# Patient Record
Sex: Male | Born: 1950
Health system: Southern US, Community
[De-identification: ages and names within clinical notes are randomized; demographics above are authoritative.]

## PROBLEM LIST (undated history)

## (undated) DIAGNOSIS — E785 Hyperlipidemia, unspecified: Secondary | ICD-10-CM

## (undated) DIAGNOSIS — F909 Attention-deficit hyperactivity disorder, unspecified type: Secondary | ICD-10-CM

## (undated) DIAGNOSIS — K760 Fatty (change of) liver, not elsewhere classified: Secondary | ICD-10-CM

## (undated) DIAGNOSIS — I803 Phlebitis and thrombophlebitis of lower extremities, unspecified: Secondary | ICD-10-CM

## (undated) HISTORY — PX: VASECTOMY: SHX75

## (undated) HISTORY — PX: GANGLION CYST EXCISION: SHX1691

## (undated) HISTORY — PX: HAND SURGERY: SHX662

## (undated) HISTORY — DX: Attention-deficit hyperactivity disorder, unspecified type: F90.9

## (undated) HISTORY — DX: Fatty (change of) liver, not elsewhere classified: K76.0

## (undated) HISTORY — DX: Hyperlipidemia, unspecified: E78.5

## (undated) HISTORY — PX: INFECTED SKIN DEBRIDEMENT: SHX678

---

## 1999-05-14 ENCOUNTER — Encounter: Admission: RE | Admit: 1999-05-14 | Discharge: 1999-05-14 | Payer: Self-pay | Admitting: Emergency Medicine

## 2000-06-11 ENCOUNTER — Encounter: Admission: RE | Admit: 2000-06-11 | Discharge: 2000-06-11 | Payer: Self-pay | Admitting: Oncology

## 2002-07-01 ENCOUNTER — Encounter: Admission: RE | Admit: 2002-07-01 | Discharge: 2002-07-01 | Payer: Self-pay | Admitting: Oncology

## 2003-06-17 ENCOUNTER — Ambulatory Visit (HOSPITAL_COMMUNITY): Admission: RE | Admit: 2003-06-17 | Discharge: 2003-06-17 | Payer: Self-pay | Admitting: Obstetrics & Gynecology

## 2003-07-01 ENCOUNTER — Encounter: Admission: RE | Admit: 2003-07-01 | Discharge: 2003-07-01 | Payer: Self-pay | Admitting: Oncology

## 2003-08-15 ENCOUNTER — Ambulatory Visit (HOSPITAL_COMMUNITY): Admission: RE | Admit: 2003-08-15 | Discharge: 2003-08-15 | Payer: Self-pay | Admitting: Obstetrics and Gynecology

## 2003-09-03 ENCOUNTER — Ambulatory Visit (HOSPITAL_COMMUNITY): Admission: RE | Admit: 2003-09-03 | Discharge: 2003-09-03 | Payer: Self-pay | Admitting: Internal Medicine

## 2004-02-14 ENCOUNTER — Emergency Department (HOSPITAL_COMMUNITY): Admission: EM | Admit: 2004-02-14 | Discharge: 2004-02-14 | Payer: Self-pay | Admitting: Emergency Medicine

## 2004-02-26 ENCOUNTER — Emergency Department (HOSPITAL_COMMUNITY): Admission: EM | Admit: 2004-02-26 | Discharge: 2004-02-26 | Payer: Self-pay | Admitting: Emergency Medicine

## 2004-02-26 ENCOUNTER — Ambulatory Visit (HOSPITAL_COMMUNITY): Admission: RE | Admit: 2004-02-26 | Discharge: 2004-02-26 | Payer: Self-pay | Admitting: Obstetrics and Gynecology

## 2004-08-09 ENCOUNTER — Encounter: Admission: RE | Admit: 2004-08-09 | Discharge: 2004-08-09 | Payer: Self-pay | Admitting: Oncology

## 2004-08-09 ENCOUNTER — Ambulatory Visit (HOSPITAL_COMMUNITY): Payer: Self-pay | Admitting: Oncology

## 2006-09-29 ENCOUNTER — Emergency Department (HOSPITAL_COMMUNITY): Admission: EM | Admit: 2006-09-29 | Discharge: 2006-09-29 | Payer: Self-pay | Admitting: Emergency Medicine

## 2006-10-01 ENCOUNTER — Ambulatory Visit: Payer: Self-pay | Admitting: Orthopedic Surgery

## 2006-10-01 ENCOUNTER — Ambulatory Visit (HOSPITAL_COMMUNITY): Payer: Self-pay | Admitting: Orthopedic Surgery

## 2006-10-01 ENCOUNTER — Encounter (HOSPITAL_COMMUNITY): Admission: RE | Admit: 2006-10-01 | Discharge: 2006-10-02 | Payer: Self-pay | Admitting: Emergency Medicine

## 2006-10-03 ENCOUNTER — Ambulatory Visit: Payer: Self-pay | Admitting: Orthopedic Surgery

## 2006-10-03 ENCOUNTER — Encounter (HOSPITAL_COMMUNITY): Admission: RE | Admit: 2006-10-03 | Discharge: 2006-11-02 | Payer: Self-pay | Admitting: Orthopedic Surgery

## 2006-10-10 ENCOUNTER — Ambulatory Visit: Payer: Self-pay | Admitting: Orthopedic Surgery

## 2006-10-10 DIAGNOSIS — S61209A Unspecified open wound of unspecified finger without damage to nail, initial encounter: Secondary | ICD-10-CM | POA: Insufficient documentation

## 2006-10-12 ENCOUNTER — Ambulatory Visit (HOSPITAL_COMMUNITY): Payer: Self-pay | Admitting: Obstetrics and Gynecology

## 2006-10-18 ENCOUNTER — Ambulatory Visit: Payer: Self-pay | Admitting: Orthopedic Surgery

## 2006-10-18 DIAGNOSIS — L03019 Cellulitis of unspecified finger: Secondary | ICD-10-CM

## 2006-10-18 DIAGNOSIS — L02519 Cutaneous abscess of unspecified hand: Secondary | ICD-10-CM | POA: Insufficient documentation

## 2006-10-22 ENCOUNTER — Ambulatory Visit: Payer: Self-pay | Admitting: Orthopedic Surgery

## 2006-10-22 DIAGNOSIS — M009 Pyogenic arthritis, unspecified: Secondary | ICD-10-CM | POA: Insufficient documentation

## 2006-10-23 ENCOUNTER — Ambulatory Visit (HOSPITAL_COMMUNITY): Admission: RE | Admit: 2006-10-23 | Discharge: 2006-10-23 | Payer: Self-pay | Admitting: Orthopedic Surgery

## 2006-10-23 ENCOUNTER — Ambulatory Visit: Payer: Self-pay | Admitting: Orthopedic Surgery

## 2006-10-29 ENCOUNTER — Ambulatory Visit: Payer: Self-pay | Admitting: Infectious Disease

## 2006-10-29 DIAGNOSIS — I808 Phlebitis and thrombophlebitis of other sites: Secondary | ICD-10-CM | POA: Insufficient documentation

## 2006-10-29 DIAGNOSIS — M86649 Other chronic osteomyelitis, unspecified hand: Secondary | ICD-10-CM | POA: Insufficient documentation

## 2006-10-29 DIAGNOSIS — D682 Hereditary deficiency of other clotting factors: Secondary | ICD-10-CM | POA: Insufficient documentation

## 2006-10-29 LAB — CONVERTED CEMR LAB
AST: 90 units/L — ABNORMAL HIGH (ref 0–37)
BUN: 21 mg/dL (ref 6–23)
Basophils Absolute: 0 10*3/uL (ref 0.0–0.1)
Basophils Relative: 0 % (ref 0–1)
Calcium: 9.1 mg/dL (ref 8.4–10.5)
Chloride: 103 meq/L (ref 96–112)
Creatinine, Ser: 1.26 mg/dL (ref 0.40–1.50)
Eosinophils Absolute: 0.1 10*3/uL (ref 0.0–0.7)
Eosinophils Relative: 1 % (ref 0–5)
HCT: 48.2 % (ref 39.0–52.0)
Hemoglobin: 15.6 g/dL (ref 13.0–17.0)
Lymphocytes Relative: 22 % (ref 12–46)
MCHC: 32.4 g/dL (ref 30.0–36.0)
MCV: 79.9 fL (ref 78.0–100.0)
Monocytes Absolute: 0.6 10*3/uL (ref 0.2–0.7)
Platelets: 220 10*3/uL (ref 150–400)
RDW: 14.4 % — ABNORMAL HIGH (ref 11.5–14.0)
Sed Rate: 3 mm/hr (ref 0–16)
Total Bilirubin: 0.8 mg/dL (ref 0.3–1.2)

## 2006-10-30 ENCOUNTER — Ambulatory Visit (HOSPITAL_COMMUNITY): Admission: RE | Admit: 2006-10-30 | Discharge: 2006-10-30 | Payer: Self-pay | Admitting: Orthopedic Surgery

## 2006-10-31 ENCOUNTER — Encounter: Payer: Self-pay | Admitting: Infectious Disease

## 2006-10-31 ENCOUNTER — Ambulatory Visit (HOSPITAL_COMMUNITY): Admission: RE | Admit: 2006-10-31 | Discharge: 2006-11-01 | Payer: Self-pay | Admitting: Orthopedic Surgery

## 2006-10-31 ENCOUNTER — Encounter (INDEPENDENT_AMBULATORY_CARE_PROVIDER_SITE_OTHER): Payer: Self-pay | Admitting: Orthopedic Surgery

## 2006-11-15 ENCOUNTER — Encounter: Payer: Self-pay | Admitting: Orthopedic Surgery

## 2006-11-20 ENCOUNTER — Encounter: Payer: Self-pay | Admitting: Orthopedic Surgery

## 2006-11-28 ENCOUNTER — Ambulatory Visit (HOSPITAL_COMMUNITY): Admission: RE | Admit: 2006-11-28 | Discharge: 2006-11-28 | Payer: Self-pay | Admitting: Internal Medicine

## 2006-12-14 ENCOUNTER — Encounter: Payer: Self-pay | Admitting: Infectious Disease

## 2006-12-17 ENCOUNTER — Ambulatory Visit: Payer: Self-pay | Admitting: Infectious Disease

## 2006-12-17 LAB — CONVERTED CEMR LAB
Albumin: 4.5 g/dL (ref 3.5–5.2)
BUN: 24 mg/dL — ABNORMAL HIGH (ref 6–23)
CO2: 25 meq/L (ref 19–32)
CRP: 0.1 mg/dL (ref <0.6)
Glucose, Bld: 112 mg/dL — ABNORMAL HIGH (ref 70–99)
HCT: 44.6 % (ref 39.0–52.0)
Hemoglobin: 14.3 g/dL (ref 13.0–17.0)
MCHC: 32.1 g/dL (ref 30.0–36.0)
Potassium: 4.5 meq/L (ref 3.5–5.3)
RBC: 5.55 M/uL (ref 4.22–5.81)
Sodium: 142 meq/L (ref 135–145)
Total Bilirubin: 0.5 mg/dL (ref 0.3–1.2)
Total Protein: 6.9 g/dL (ref 6.0–8.3)

## 2006-12-26 ENCOUNTER — Encounter: Payer: Self-pay | Admitting: Orthopedic Surgery

## 2008-01-21 ENCOUNTER — Ambulatory Visit (HOSPITAL_COMMUNITY): Admission: RE | Admit: 2008-01-21 | Discharge: 2008-01-21 | Payer: Self-pay | Admitting: Family Medicine

## 2008-07-16 ENCOUNTER — Ambulatory Visit (HOSPITAL_COMMUNITY): Admission: RE | Admit: 2008-07-16 | Discharge: 2008-07-16 | Payer: Self-pay | Admitting: Internal Medicine

## 2010-01-23 ENCOUNTER — Encounter: Payer: Self-pay | Admitting: Orthopedic Surgery

## 2010-05-17 NOTE — Op Note (Signed)
NAMEMarland Kitchen  Kenneth Zimmerman, Kenneth Zimmerman NO.:  [id number]   MEDICAL RECORD NO.:  [id number]          PATIENT TYPE:  OIB   LOCATION:  3015                         FACILITY:  [facility]   PHYSICIAN:  Kenneth Ano. Gramig III, Kenneth ZimmermanDATE OF BIRTH:  1950-11-25   DATE OF PROCEDURE:  10/31/2006  DATE OF DISCHARGE:                               OPERATIVE REPORT   PREOPERATIVE DIAGNOSIS:  Right middle finger infection, 30 days out from  initial injury, with continued drainage, chondrolysis about the joint,  and evidence of osteomyelitis per radiographs.  Plan for irrigation and  debridement and bone biopsy.   POSTOPERATIVE DIAGNOSIS:  Right middle finger infection, 30 days out  from initial injury, with continued drainage, chondrolysis about the  joint, and evidence of osteomyelitis per radiographs.  Plan for  irrigation and debridement and bone biopsy.   PROCEDURES:  1. Irrigation and debridement with DIP arthrotomy and debridement of      skin, subcutaneous tissue, tendon, and joint tissue.  2. Bone biopsy, deep in nature, about the middle phalanx and distal      phalanx at the DIP joint.   SURGEON:  Kenneth Zimmerman, M.D.   ASSISTANT:  None.   COMPLICATIONS:  None.   ANESTHESIA:  General.   TOURNIQUET TIME:  Less than an hour.   INDICATIONS FOR THE PROCEDURE:  Kenneth Zimmerman is a 60 year old who  sustained a mowing injury 30 days ago.  He has been treated with an  array of antibiotics, p.o.  Seven days ago, he had I&D by Dr. Romeo Apple  in [place].  Due to slow progress, chondrolysis, and radiographic  changes, I was asked to see him in consult.  Upon seeing the patient, I  felt he had significant chondrolysis, eburnation of the DIP joint, and  felt this represented what was likely bony reaction and probable  osteomyelitis.  I felt that he absolutely needed to be treated for  osteomyelitis and that a presumptive course of treatment for  osteomyelitis be given.  I consulted Dr. Daiva Eves  of infectious disease,  who concurred.  It was recommended a bone biopsy be obtained.  He was  off antibiotics for 48 hours prior to today's I&D.  He was consented.  Arm was marked.  All instructions were given.   In the operative suite, the patient underwent smooth induction of  anesthesia, laid supine, appropriately padded, and prepped and draped in  the usual sterile fashion.  Once this was done, his prior incision S-  shaped in nature was reopened.  This was opened with pressure only, and  following this I took cultures and scraped the peritendinous tissue.  A  collagenous debris was noted, and de-epithelialized tissue was removed.  This was the initial culture.  This was done with Swab, aerobic,  anaerobic, and I sent all cultures for aerobic, anaerobic, AFB,  atypical, mycobacterial cultures, and fungal cultures.  Following this,  I then removed Monocryl sutures in the extensor apparatus.  There were  multiple sutures.  The tendon was significantly attenuated, and it was  debrided.  The central slip had marked  changes and poor continuity.  This appeared to have worsened since his initial I&D, per my  conversation with Dr. Romeo Apple.   Following this, I entered the DIP joint and performed a thorough and  aggressive irrigation and debridement.  There was devitalized tissue in  this region.  Following this, I took bone cultures of the distal phalanx  and middle phalanx.  The patient had a poor-appearing bone, and  vascularity was a bit tenuous at the distal edge of the middle phalanx.  The patient then underwent irrigation with 3 liters of saline.  Following this, a Penrose drain was placed, and two Prolene sutures were  placed. but the medial and lateral margins of the wound were left open.  I dressing him with Adaptic and gauze, followed by sterile wrap.  He  tolerated this well.   I will consult ID in the recovery room for his antibiotic measures.  I  have discussed with the  patient these issues at length, dos and don'ts,  etc.   I sent tissue culture, bone culture, and bone specimen to pathology, and  general swabs of the wound.  The patient tolerated this well.  We are  going to plan for an aggressive course of treatment.  It is my hope that  this joint may autofuse by itself, but I do not feel he will have a good  joint given the extensor attenuation and the eburnated bone and advanced  chondrolysis changes.  I have discussed this with the patient at length  preoperatively and verified this intraoperatively of course.  All  questions have been encouraged and answered.           ______________________________  Kenneth Zimmerman, M.D.     Nash Mantis  D:  10/31/2006  T:  11/01/2006  Job:  [id number]   cc:   Acey Lav, MD  Vickki Hearing, M.D.

## 2010-05-17 NOTE — Op Note (Signed)
NAME:  CAREY, JOHNDROW NO.:  [id number]   MEDICAL RECORD NO.:  [id number]          PATIENT TYPE:  AMB   LOCATION:  DAY                           FACILITY:  [facility]   PHYSICIAN:  Lionel December, M.D.    DATE OF BIRTH:  [birth year]   DATE OF PROCEDURE:  DATE OF DISCHARGE:  07/16/2008                               OPERATIVE REPORT   PROCEDURE:  Colonoscopy.   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  His last exam was 8 years  ago and was normal.  His mother had surgical resection for a large  adenoma.  His maternal uncle had colon carcinoma in his 21s.  He has no  GI symptoms other than occasional fecal seepage.  Procedure and risks  were reviewed with the patient, informed consent was obtained.   MEDICATIONS FOR CONSCIOUS SEDATION:  Demerol 100 mg IV in divided dose,  Versed 8 mg IV.   FINDINGS:  Procedure performed in Endoscopy Suite.  The patient's vital  signs and O2 sat were monitored during the procedure and remained  stable.  The patient was placed in left lateral recumbent position,  rectal examination performed.  No abnormality noted on external or  digital exam.  Sphincter tone appeared to be normal.  Pentax videoscope  was placed in the rectum and advanced under vision into sigmoid colon  beyond.  The tortuous sigmoid colon.  Some difficulty in passing the  scope across the splenic flexure again across the hepatic flexure.  He  had lot of liquid stool, but overall prep was very satisfactory.  The  scope was passed into the cecum, which was identified by ileocecal  valve.  He had to return 3 times in order to see rest of the cecum.  Pictures taken for the record.  As the scope was withdrawn, colonic  mucosa was once again carefully examined and no polyps and no tumor  masses were noted. The rectal mucosa was normal.  The scope was  retroflexed to examine anorectal junction, which was  unremarkable.  Endoscope was straightened and withdrawn.  The patient tolerated the  procedure well.   Withdrawal time was 8 minutes.   FINAL DIAGNOSIS:  Redundant, otherwise normal colonoscopy.   RECOMMENDATIONS:  1. High-fiber diet plus fiber supplement 3 to 4 g daily.  If he still      experiences intermittent seepage, I would like him to take      loperamide OTC 2 mg daily.  If these measures do not help, give me      a call.  2. He should consider next screening in 5 years.      Lionel December, M.D.  Electronically Signed    NR/MEDQ  D:  07/16/2008  T:  07/17/2008  Job:  [id number]

## 2010-05-17 NOTE — Op Note (Signed)
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  The patient injured his hand on  09/28 in a farm[place].  Apparently he scraped as across a bolt and this  opened up his finger near the DIP joint.  He was treated in the  emergency room with closure of the wound after irrigation.  He was also  treated with Rocephin and Levaquin, changed to clindamycin when his  symptoms did not improve.  He reported increased pain on the weekend and  we decided to proceed with surgical incision and drainage.   PREOPERATIVE DIAGNOSIS:  Cellulitis right long finger.   POSTOPERATIVE DIAGNOSIS:  Septic arthritis.   PROCEDURE:  Right long finger distal interphalangeal joint arthrotomy  incision and drainage.   SURGEON:  Vickki Hearing, M.D., no assistants.   ANESTHESIA:  Bier block.   FINDINGS:  Chondrolysis of the joint.  Cultures were obtained.  They  were sent for micro, no blood loss.  The patient to recovery room in  stable condition.   DETAILS OF PROCEDURE:  After proper identification of the patient and  marking of the right long finger as the surgical site, the patient was  taken to surgery where he had a Bier block.  The wound was extended  proximally and distally on either side and the extensor tendon was found  to be 50% still intact with 50% of injury of the tendon.  This area was  taken advantage of and the joint was opened.  There was a chondrolysis  of the joint but no purulence. The tendon was repaired.  After  irrigation and debridement of the wound and the joint,  Penrose drain was  inserted and 3-0 nylon sutures were used to close the wound partially.  The finger was dressed sterilely.  The patient was taken to recovery  room in stable condition.   POSTOPERATIVE PLAN:  For continued antibiotics, follow-up visit for  dressing change.      Vickki Hearing, M.D.  Electronically Signed    SEH/MEDQ  D:  11/14/2006  T:  11/14/2006  Job:  [id number]

## 2010-05-17 NOTE — Procedures (Signed)
NAME:  CAMARI, WISHAM NO.:  [id number]   MEDICAL RECORD NO.:  [id number]          PATIENT TYPE:  OUT   LOCATION:  DFTL                          FACILITY:  [facility]   PHYSICIAN:  Donna Bernard, M.D.DATE OF BIRTH:  1950/12/28   DATE OF PROCEDURE:  01/21/2008  DATE OF DISCHARGE:                                  STRESS TEST   INDICATIONS FOR THE TEST:  This patient is a 60 year old white male with  a history of atypical chest discomfort.  Frankly, the discomfort sounds  more like reflux; however, with a strong family history of coronary  artery disease and personal risk factors, the stress test was performed  for the purposes of risk stratification and also in preparation of  resuming an active exercise program.   Stress test was performed in standard Bruce protocol.  Resting EKG  revealed normal sinus rhythm.  No significant ST-T changes.  The  patient's starting blood pressure was 148/88.  The patient tolerated the  first 3 stages of the test extremely well.  He was initially slow to  increase his heart rate substantially, which is in itself a good sign.  He reached all the way into midway through the fourth stage.  At this  point, his heart rate was 154.  This exceeded and the sub max predicted  heart rate is maximum, predicted heart rate is 160.  At peak exercise,  he reached a systolic of 180, which is considered also within normal  limits.  At the peak heart rate at 0.08 seconds pass the J-point, there  was minimal ST depression in the chest and limb leads.  In addition, the  slope at this point was sharply ascending.   IMPRESSION:  Negative adequate stress test.   PLAN:  The patient given full permission to return to very active  exercise program.      W. Simone Curia, M.D.  Electronically Signed     WSL/MEDQ  D:  01/21/2008  T:  01/21/2008  Job:  [id number]

## 2010-10-12 LAB — DIFFERENTIAL
Basophils Relative: 1
Lymphocytes Relative: 24
Lymphs Abs: 1.5
Monocytes Relative: 8
Neutro Abs: 3.9
Neutrophils Relative %: 64

## 2010-10-12 LAB — HEPATIC FUNCTION PANEL
AST: 54 — ABNORMAL HIGH
Albumin: 3.9
Alkaline Phosphatase: 64
Total Bilirubin: 0.9

## 2010-10-12 LAB — BASIC METABOLIC PANEL
Calcium: 9.1
Creatinine, Ser: 1.15
GFR calc Af Amer: >60
GFR calc non Af Amer: >60

## 2010-10-12 LAB — ANAEROBIC CULTURE

## 2010-10-12 LAB — FUNGUS CULTURE W SMEAR: Fungal Smear: NONE SEEN

## 2010-10-12 LAB — HEMOGLOBIN A1C: Mean Plasma Glucose: 111

## 2010-10-12 LAB — AFB CULTURE WITH SMEAR (NOT AT ARMC): Acid Fast Smear: NONE SEEN

## 2010-10-12 LAB — TISSUE CULTURE
Culture: NO GROWTH
Gram Stain: NONE SEEN

## 2010-10-12 LAB — CBC
RBC: 5.49
WBC: 6

## 2010-10-12 LAB — WOUND CULTURE

## 2010-10-12 LAB — CULTURE, ROUTINE-ABSCESS

## 2011-07-25 ENCOUNTER — Other Ambulatory Visit (HOSPITAL_COMMUNITY)
Admission: RE | Admit: 2011-07-25 | Discharge: 2011-07-25 | Disposition: A | Discharge status: Home / Self Care | Payer: 59 | Source: Ambulatory Visit | Attending: General Surgery | Admitting: General Surgery

## 2011-07-25 ENCOUNTER — Other Ambulatory Visit: Payer: Self-pay | Admitting: General Surgery

## 2011-07-25 DIAGNOSIS — D235 Other benign neoplasm of skin of trunk: Secondary | ICD-10-CM | POA: Insufficient documentation

## 2012-04-21 ENCOUNTER — Encounter: Payer: Self-pay | Admitting: Internal Medicine

## 2012-04-21 DIAGNOSIS — I808 Phlebitis and thrombophlebitis of other sites: Secondary | ICD-10-CM

## 2012-04-23 ENCOUNTER — Encounter: Payer: Self-pay | Admitting: Orthopedic Surgery

## 2012-04-23 ENCOUNTER — Ambulatory Visit (INDEPENDENT_AMBULATORY_CARE_PROVIDER_SITE_OTHER): Payer: 59 | Admitting: Orthopedic Surgery

## 2012-04-23 ENCOUNTER — Ambulatory Visit (INDEPENDENT_AMBULATORY_CARE_PROVIDER_SITE_OTHER): Payer: 59

## 2012-04-23 VITALS — BP 104/64 | Ht 77.0 in | Wt 282.0 lb

## 2012-04-23 DIAGNOSIS — S83282A Other tear of lateral meniscus, current injury, left knee, initial encounter: Secondary | ICD-10-CM

## 2012-04-23 DIAGNOSIS — M25569 Pain in unspecified knee: Secondary | ICD-10-CM

## 2012-04-23 DIAGNOSIS — S83289A Other tear of lateral meniscus, current injury, unspecified knee, initial encounter: Secondary | ICD-10-CM | POA: Insufficient documentation

## 2012-04-23 DIAGNOSIS — M25562 Pain in left knee: Secondary | ICD-10-CM

## 2012-04-23 NOTE — Progress Notes (Signed)
Patient ID: Kenneth Zimmerman, male   DOB: [birth year], 62 y.o.   MRN: [id number] Chief Complaint  Patient presents with  . 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 The symptoms came on suddenly and have been marked by 2 episodes of giving way. Pain is sharp stabbing brief an 8/10. The symptoms seem to come and go and is worse with valgus load on the knee. Review of systems all negative  Allergies negative  Heterozygote factor V Leyden  Obesity  Surgery right long finger x2 for infection  [employer]  Family history heart disease and diabetes  Married  No smoking  Rare social alcohol use and caffeine use noted   Occupation Geologist, engineering  Physical Exam(12)  Vital signs:   GENERAL: normal development   CDV: pulses are normal   Skin: normal  Lymph: nodes were not palpable/normal  Psychiatric: awake, alert and oriented  Neuro: normal sensation  MSK  Gait: Altered gait  Upper extremity exam  The right and left upper extremity:   Inspection and palpation revealed no abnormalities in the upper extremities.   Range of motion is full without contracture.  Motor exam is normal with grade 5 strength.  The joints are fully reduced without subluxation.  There is no atrophy or tremor and muscle tone is normal.  All joints are stable.    1 Inspection tenderness lateral compartment 2 Range of Motion full range of motion 3 Motor grade 5 4 Stability normal Positive McMurray sign laterally  Other side:  5 normal range of motion strength stability alignment 6 bilateral normal cardiovascular pulses  Imaging x-rays show possible loose bodies mild arthritis some patellofemoral disease  Assessment: Lateral meniscal tear loose body    Plan: Arthroscopic surgery left knee  removal of loose bodies and lateral meniscectomy  I discussed our options included preoperative MRI but he agrees with me that we should just proceed based on his symptoms  He will get back to [place] regarding a date

## 2012-04-23 NOTE — Patient Instructions (Signed)
Arthroscopic Procedure, Knee An arthroscopic procedure can find what is wrong with your knee. PROCEDURE Arthroscopy is a surgical technique that allows your orthopedic surgeon to diagnose and treat your knee injury with accuracy. They will look into your knee through a small instrument. This is almost like a small (pencil sized) telescope. Because arthroscopy affects your knee less than open knee surgery, you can anticipate a more rapid recovery. Taking an active role by following your caregiver's instructions will help with rapid and complete recovery. Use crutches, rest, elevation, ice, and knee exercises as instructed. The length of recovery depends on various factors including type of injury, age, physical condition, medical conditions, and your rehabilitation. Your knee is the joint between the large bones (femur and tibia) in your leg. Cartilage covers these bone ends which are smooth and slippery and allow your knee to bend and move smoothly. Two menisci, thick, semi-lunar shaped pads of cartilage which form a rim inside the joint, help absorb shock and stabilize your knee. Ligaments bind the bones together and support your knee joint. Muscles move the joint, help support your knee, and take stress off the joint itself. Because of this all programs and physical therapy to rehabilitate an injured or repaired knee require rebuilding and strengthening your muscles. AFTER THE PROCEDURE  After the procedure, you will be moved to a recovery area until most of the effects of the medication have worn off. Your caregiver will discuss the test results with you.   Only take over-the-counter or prescription medicines for pain, discomfort, or fever as directed by your caregiver.  SEEK MEDICAL CARE IF:    You have increased bleeding from your wounds.   You see redness, swelling, or have increasing pain in your wounds.   You have pus coming from your wound.   You have an oral temperature above 102 F  (38.9 C).   You notice a bad smell coming from the wound or dressing.   You have severe pain with any motion of your knee.  SEEK IMMEDIATE MEDICAL CARE IF:    You develop a rash.   You have difficulty breathing.   You have any allergic problems.  Document Released: 12/17/1999 Document Revised: 03/13/2011 Document Reviewed: 07/10/2007 [employer] Patient Information 2013 [employer].    

## 2012-10-21 ENCOUNTER — Encounter: Payer: Self-pay | Admitting: Family Medicine

## 2012-10-21 ENCOUNTER — Other Ambulatory Visit: Payer: Self-pay

## 2012-10-21 ENCOUNTER — Ambulatory Visit (HOSPITAL_COMMUNITY)
Admission: RE | Admit: 2012-10-21 | Discharge: 2012-10-21 | Disposition: A | Discharge status: Home / Self Care | Payer: 59 | Source: Ambulatory Visit | Attending: Family Medicine | Admitting: Family Medicine

## 2012-10-21 ENCOUNTER — Ambulatory Visit (INDEPENDENT_AMBULATORY_CARE_PROVIDER_SITE_OTHER): Payer: 59 | Admitting: Family Medicine

## 2012-10-21 VITALS — BP 122/84 | Ht 76.5 in | Wt 296.8 lb

## 2012-10-21 DIAGNOSIS — I8000 Phlebitis and thrombophlebitis of superficial vessels of unspecified lower extremity: Secondary | ICD-10-CM | POA: Insufficient documentation

## 2012-10-21 DIAGNOSIS — D6851 Activated protein C resistance: Secondary | ICD-10-CM

## 2012-10-21 DIAGNOSIS — I809 Phlebitis and thrombophlebitis of unspecified site: Secondary | ICD-10-CM

## 2012-10-21 DIAGNOSIS — D6859 Other primary thrombophilia: Secondary | ICD-10-CM

## 2012-10-21 DIAGNOSIS — I82819 Embolism and thrombosis of superficial veins of unspecified lower extremities: Secondary | ICD-10-CM | POA: Insufficient documentation

## 2012-10-21 LAB — D-DIMER, QUANTITATIVE: D-Dimer, Quant: 0.4 ug/mL-FEU (ref 0.00–0.48)

## 2012-10-21 NOTE — Progress Notes (Signed)
  Subjective:    Patient ID: Kenneth Zimmerman, male    DOB: Apr 02, 1950, 62 y.o.   MRN: [id number]  HPIPatient arrives with pain and swelling in left leg for 9 days. No fever or chills. Recalls no injury. No  Positive history of superficial thrombophlebitis with the left leg greater saphenous vein.  Known factor V Leiden deficiency.   Review of Systems No chest pain no shortness of breath no abdominal pain no fever or chills ROS otherwise negative    Objective:   Physical Exam  Alert no apparent distress. Lungs clear. Heart rare rhythm. Vital stable. Left leg tenderness from medial calf the medial thigh with distinct erythema along the distribution of the external saphenous vein      Assessment & Plan:  Impression superficial thrombophlebitis discussed at length. We did an ultrasound which revealed this. Also d-dimer was normal at 0.40 #2 factor V Leiden deficiency heterozygous which plays into this. Plan initiate Coumadin 5 mg daily. Initiate twice a day Lovenox rationale discussed. Recheck in 7 days. Warning signs discussed.

## 2012-10-22 DIAGNOSIS — D6851 Activated protein C resistance: Secondary | ICD-10-CM | POA: Insufficient documentation

## 2012-10-22 DIAGNOSIS — I809 Phlebitis and thrombophlebitis of unspecified site: Secondary | ICD-10-CM | POA: Insufficient documentation

## 2012-10-29 ENCOUNTER — Encounter: Payer: Self-pay | Admitting: Family Medicine

## 2012-10-29 ENCOUNTER — Ambulatory Visit (INDEPENDENT_AMBULATORY_CARE_PROVIDER_SITE_OTHER): Payer: 59 | Admitting: Family Medicine

## 2012-10-29 VITALS — BP 138/82 | Ht 77.0 in | Wt 297.0 lb

## 2012-10-29 DIAGNOSIS — I809 Phlebitis and thrombophlebitis of unspecified site: Secondary | ICD-10-CM

## 2012-10-29 DIAGNOSIS — Z7901 Long term (current) use of anticoagulants: Secondary | ICD-10-CM

## 2012-10-29 LAB — POCT INR: INR: 1.1

## 2012-10-29 NOTE — Progress Notes (Signed)
  Subjective:    Patient ID: Kenneth Zimmerman, male    DOB: [birth year], 62 y.o.   MRN: [id number]  HPIHere for a follow up. INR today 1.1 Taking 5 mg tablets one every am. And lovenox 150mg  BID. Missed one dose of coumadin last sat. No evidence of bleeding. Needs refill on lovenox.  Redness is gone. Less discomfort.   No chest pain no shortness of breath    Review of Systems Otherwise negative    Objective:   Physical Exam  Alert lungs clear. Heart regular in rhythm. H&T normal. Left medial thigh and calf much less erythema minimal tenderness      Assessment & Plan:  Impression improving superficial thrombophlebitis plan increase Coumadin to 7.5 today 5 tomorrow 7.5 Thursday recheck INR on Friday. May finish Lovenox when done likely on Thursday since this is a rather aggressive regimen for superficial thromb phlebitis anyway plan recheck on Friday INR

## 2012-10-29 NOTE — Patient Instructions (Signed)
One and a half tab daily of the coumadin

## 2012-11-01 ENCOUNTER — Ambulatory Visit (INDEPENDENT_AMBULATORY_CARE_PROVIDER_SITE_OTHER): Payer: 59 | Admitting: *Deleted

## 2012-11-01 DIAGNOSIS — Z7901 Long term (current) use of anticoagulants: Secondary | ICD-10-CM

## 2012-11-01 LAB — POCT INR: INR: 1.2

## 2012-11-07 ENCOUNTER — Ambulatory Visit: Payer: 59

## 2012-11-08 ENCOUNTER — Ambulatory Visit (INDEPENDENT_AMBULATORY_CARE_PROVIDER_SITE_OTHER): Payer: 59 | Admitting: *Deleted

## 2012-11-08 DIAGNOSIS — Z79899 Other long term (current) drug therapy: Secondary | ICD-10-CM

## 2012-11-08 LAB — POCT INR: INR: 1.2

## 2012-11-11 ENCOUNTER — Other Ambulatory Visit: Payer: Self-pay | Admitting: Family Medicine

## 2012-11-15 ENCOUNTER — Ambulatory Visit: Payer: 59 | Admitting: *Deleted

## 2012-11-19 ENCOUNTER — Ambulatory Visit (INDEPENDENT_AMBULATORY_CARE_PROVIDER_SITE_OTHER): Payer: 59 | Admitting: *Deleted

## 2012-11-19 DIAGNOSIS — Z7901 Long term (current) use of anticoagulants: Secondary | ICD-10-CM

## 2012-11-19 LAB — POCT INR: INR: 1.7

## 2012-11-19 NOTE — Patient Instructions (Signed)
Take 7.5 mg on tues, wed take 10mg , take 10mg  on thurs, frid take 7.5mg , sat take 10mg , sun 10mg , mon 7.5 mg. Recheck in one week

## 2012-11-20 DIAGNOSIS — Z0289 Encounter for other administrative examinations: Secondary | ICD-10-CM

## 2012-11-26 ENCOUNTER — Ambulatory Visit (INDEPENDENT_AMBULATORY_CARE_PROVIDER_SITE_OTHER): Payer: 59 | Admitting: *Deleted

## 2012-11-26 DIAGNOSIS — Z7901 Long term (current) use of anticoagulants: Secondary | ICD-10-CM

## 2012-12-11 ENCOUNTER — Ambulatory Visit: Payer: 59

## 2013-01-25 ENCOUNTER — Encounter: Payer: Self-pay | Admitting: *Deleted

## 2013-02-06 ENCOUNTER — Ambulatory Visit (INDEPENDENT_AMBULATORY_CARE_PROVIDER_SITE_OTHER): Payer: 59 | Admitting: *Deleted

## 2013-02-06 ENCOUNTER — Other Ambulatory Visit: Payer: Self-pay | Admitting: Family Medicine

## 2013-02-06 DIAGNOSIS — Z7901 Long term (current) use of anticoagulants: Secondary | ICD-10-CM

## 2013-02-06 LAB — POCT INR: INR: 1.6

## 2013-02-06 NOTE — Patient Instructions (Signed)
Take 10mg  7 days a week. No missed doses. Recheck in 3 weeks.

## 2013-02-28 ENCOUNTER — Ambulatory Visit: Payer: 59

## 2013-09-02 ENCOUNTER — Telehealth: Payer: Self-pay | Admitting: Family Medicine

## 2013-09-02 ENCOUNTER — Telehealth: Payer: Self-pay | Admitting: *Deleted

## 2013-09-02 DIAGNOSIS — R109 Unspecified abdominal pain: Secondary | ICD-10-CM

## 2013-09-02 LAB — BASIC METABOLIC PANEL
BUN: 17 mg/dL (ref 6–23)
CALCIUM: 9.2 mg/dL (ref 8.4–10.5)
CO2: 26 mEq/L (ref 19–32)
CREATININE: 1.16 mg/dL (ref 0.50–1.35)
Chloride: 104 mEq/L (ref 96–112)
GLUCOSE: 92 mg/dL (ref 70–99)
Potassium: 4.4 mEq/L (ref 3.5–5.3)
SODIUM: 138 meq/L (ref 135–145)

## 2013-09-02 LAB — URINALYSIS, ROUTINE W REFLEX MICROSCOPIC
BILIRUBIN URINE: NEGATIVE
GLUCOSE, UA: NEGATIVE mg/dL
Hgb urine dipstick: NEGATIVE
Ketones, ur: NEGATIVE mg/dL
LEUKOCYTES UA: NEGATIVE
Nitrite: NEGATIVE
Protein, ur: NEGATIVE mg/dL
Specific Gravity, Urine: 1.027 (ref 1.005–1.030)
UROBILINOGEN UA: 0.2 mg/dL (ref 0.0–1.0)
pH: 5.5 (ref 5.0–8.0)

## 2013-09-02 LAB — HEPATIC FUNCTION PANEL
ALBUMIN: 4.2 g/dL (ref 3.5–5.2)
ALK PHOS: 59 U/L (ref 39–117)
ALT: 31 U/L (ref 0–53)
AST: 22 U/L (ref 0–37)
BILIRUBIN TOTAL: 0.9 mg/dL (ref 0.2–1.2)
Bilirubin, Direct: 0.2 mg/dL (ref 0.0–0.3)
Indirect Bilirubin: 0.7 mg/dL (ref 0.2–1.2)
Total Protein: 6.3 g/dL (ref 6.0–8.3)

## 2013-09-02 LAB — CBC WITH DIFFERENTIAL/PLATELET
BASOS PCT: 1 % (ref 0–1)
Basophils Absolute: 0.1 10*3/uL (ref 0.0–0.1)
EOS ABS: 0.1 10*3/uL (ref 0.0–0.7)
EOS PCT: 2 % (ref 0–5)
HCT: 43.6 % (ref 39.0–52.0)
Hemoglobin: 15.1 g/dL (ref 13.0–17.0)
Lymphocytes Relative: 34 % (ref 12–46)
Lymphs Abs: 2 10*3/uL (ref 0.7–4.0)
MCH: 26.8 pg (ref 26.0–34.0)
MCHC: 34.6 g/dL (ref 30.0–36.0)
MCV: 77.3 fL — ABNORMAL LOW (ref 78.0–100.0)
MONOS PCT: 9 % (ref 3–12)
Monocytes Absolute: 0.5 10*3/uL (ref 0.1–1.0)
NEUTROS PCT: 54 % (ref 43–77)
Neutro Abs: 3.1 10*3/uL (ref 1.7–7.7)
Platelets: 176 10*3/uL (ref 150–400)
RBC: 5.64 MIL/uL (ref 4.22–5.81)
RDW: 14.8 % (ref 11.5–15.5)
WBC: 5.8 10*3/uL (ref 4.0–10.5)

## 2013-09-02 LAB — AMYLASE: Amylase: 30 U/L (ref 0–105)

## 2013-09-02 LAB — LIPASE: Lipase: <10 U/L (ref 0–75)

## 2013-09-02 NOTE — Telephone Encounter (Signed)
Please send in bw orders as follows   CBC, BMET, Liver Panel, Amylase, Lipase, Urinalysis   Asked for [place] to call him when the labs were sent in   Last seen 10/29/12  Last labs, none routine from what I can see on Epic

## 2013-09-02 NOTE — Telephone Encounter (Signed)
ok 

## 2013-09-02 NOTE — Telephone Encounter (Signed)
FYI: Patient has been having back pain and is worried about gallbladder and pancreas. Patient going to have labs drawn-CBC, Liver, Met 7, Lipase and Amylase and has office visit with you Friday to discuss.

## 2013-09-02 NOTE — Telephone Encounter (Signed)
Blood work orders placed in Epic. Patient notified. 

## 2013-09-05 ENCOUNTER — Encounter: Payer: Self-pay | Admitting: Family Medicine

## 2013-09-05 ENCOUNTER — Ambulatory Visit (INDEPENDENT_AMBULATORY_CARE_PROVIDER_SITE_OTHER): Payer: 59 | Admitting: Family Medicine

## 2013-09-05 VITALS — BP 124/80 | Ht 76.5 in | Wt 293.0 lb

## 2013-09-05 DIAGNOSIS — R109 Unspecified abdominal pain: Secondary | ICD-10-CM

## 2013-09-05 MED ORDER — PANTOPRAZOLE SODIUM 40 MG PO TBEC: 40.0000 mg | DELAYED_RELEASE_TABLET | Freq: Every day | ORAL | Status: DC

## 2013-09-05 NOTE — Progress Notes (Signed)
Subjective:    Patient ID: Jonnie Kind, male    DOB: Oct 09, 1950, 63 y.o.   MRN: [id number]  Back Pain This is a new problem. The current episode started in the past 7 days. The problem occurs constantly. The pain is present in the thoracic spine. The quality of the pain is described as aching. The pain does not radiate. The pain is at a severity of 7/10. The pain is moderate. The pain is the same all the time. Treatments tried: milk, zantac, gaviscon. The treatment provided moderate relief.  Possible GERD flare up.   Pt notres back pain  Last wk mid low thoracic dull ache hit this weekend  mon had large lunch,  Developed back mid pain  7 out ot 10 pain  No nausea no daphor no inspir pain  ytried milk--took gaviscon and zantac  Hx of cp and reflux to er,  No change with position  No fam hx of gb problems, hx of ultrasound  Sig impressive pain  Colon neg three yrs ago  Some fam hx, of per colon cance   Results for orders placed in visit on 09/02/13  CBC WITH DIFFERENTIAL      Result Value Ref Range   WBC 5.8  4.0 - 10.5 K/uL   RBC 5.64  4.22 - 5.81 MIL/uL   Hemoglobin 15.1  13.0 - 17.0 g/dL   HCT 43.6  39.0 - 52.0 %   MCV 77.3 (*) 78.0 - 100.0 fL   MCH 26.8  26.0 - 34.0 pg   MCHC 34.6  30.0 - 36.0 g/dL   RDW 14.8  11.5 - 15.5 %   Platelets 176  150 - 400 K/uL   Neutrophils Relative % 54  43 - 77 %   Neutro Abs 3.1  1.7 - 7.7 K/uL   Lymphocytes Relative 34  12 - 46 %   Lymphs Abs 2.0  0.7 - 4.0 K/uL   Monocytes Relative 9  3 - 12 %   Monocytes Absolute 0.5  0.1 - 1.0 K/uL   Eosinophils Relative 2  0 - 5 %   Eosinophils Absolute 0.1  0.0 - 0.7 K/uL   Basophils Relative 1  0 - 1 %   Basophils Absolute 0.1  0.0 - 0.1 K/uL   Smear Review Criteria for review not met    BASIC METABOLIC PANEL      Result Value Ref Range   Sodium 138  135 - 145 mEq/L   Potassium 4.4  3.5 - 5.3 mEq/L   Chloride 104  96 - 112 mEq/L   CO2 26  19 - 32 mEq/L   Glucose, Bld 92  70 -  99 mg/dL   BUN 17  6 - 23 mg/dL   Creat 1.16  0.50 - 1.35 mg/dL   Calcium 9.2  8.4 - 10.5 mg/dL  HEPATIC FUNCTION PANEL      Result Value Ref Range   Total Bilirubin 0.9  0.2 - 1.2 mg/dL   Bilirubin, Direct 0.2  0.0 - 0.3 mg/dL   Indirect Bilirubin 0.7  0.2 - 1.2 mg/dL   Alkaline Phosphatase 59  39 - 117 U/L   AST 22  0 - 37 U/L   ALT 31  0 - 53 U/L   Total Protein 6.3  6.0 - 8.3 g/dL   Albumin 4.2  3.5 - 5.2 g/dL  LIPASE      Result Value Ref Range   Lipase <10  0 - 75  U/L  AMYLASE      Result Value Ref Range   Amylase 30  0 - 105 U/L  URINALYSIS, ROUTINE W REFLEX MICROSCOPIC      Result Value Ref Range   Color, Urine YELLOW  YELLOW   APPearance CLEAR  CLEAR   Specific Gravity, Urine 1.027  1.005 - 1.030   pH 5.5  5.0 - 8.0   Glucose, UA NEG  NEG mg/dL   Bilirubin Urine NEG  NEG   Ketones, ur NEG  NEG mg/dL   Hgb urine dipstick NEG  NEG   Protein, ur NEG  NEG mg/dL   Urobilinogen, UA 0.2  0.0 - 1.0 mg/dL   Nitrite NEG  NEG   Leukocytes, UA NEG  NEG     Review of Systems  Musculoskeletal: Positive for back pain.   No lower, pain no change in urinary habits no weight loss no weight gain    Objective:   Physical Exam  Alert no apparent distress lungs clear. Heart regular in rhythm. H&T normal. Abdomen no tenderness no pain no rebound no guarding no flank pain no spinal tenderness lungs clear no tachypnea.      Assessment & Plan:  Impressive posterior mid back pain worse after fatty meals. Plan all blood work reviewed with patient fortunately negative. Recommend ultrasound. Recommend trial of therapy for reflux. If spells continue despite normal ultrasound would do a nuclear medicine test discussed.

## 2013-09-11 ENCOUNTER — Ambulatory Visit (HOSPITAL_COMMUNITY): Payer: 59

## 2013-09-11 ENCOUNTER — Ambulatory Visit (HOSPITAL_COMMUNITY): Admission: RE | Admit: 2013-09-11 | Payer: 59 | Source: Ambulatory Visit

## 2013-09-12 ENCOUNTER — Ambulatory Visit (HOSPITAL_COMMUNITY)
Admission: RE | Admit: 2013-09-12 | Discharge: 2013-09-12 | Disposition: A | Discharge status: Home / Self Care | Payer: 59 | Source: Ambulatory Visit | Attending: Family Medicine | Admitting: Family Medicine

## 2013-09-12 DIAGNOSIS — R1011 Right upper quadrant pain: Secondary | ICD-10-CM | POA: Diagnosis present

## 2013-09-12 DIAGNOSIS — K7689 Other specified diseases of liver: Secondary | ICD-10-CM | POA: Diagnosis not present

## 2013-11-10 ENCOUNTER — Other Ambulatory Visit: Payer: Self-pay | Admitting: Obstetrics and Gynecology

## 2013-11-10 ENCOUNTER — Other Ambulatory Visit (HOSPITAL_COMMUNITY): Payer: Self-pay | Admitting: Podiatry

## 2013-11-10 DIAGNOSIS — M79671 Pain in right foot: Secondary | ICD-10-CM

## 2013-11-12 ENCOUNTER — Ambulatory Visit (HOSPITAL_COMMUNITY)
Admission: RE | Admit: 2013-11-12 | Discharge: 2013-11-12 | Disposition: A | Discharge status: Home / Self Care | Payer: 59 | Source: Ambulatory Visit | Attending: Podiatry | Admitting: Podiatry

## 2013-11-12 DIAGNOSIS — M25474 Effusion, right foot: Secondary | ICD-10-CM | POA: Diagnosis not present

## 2013-11-12 DIAGNOSIS — R937 Abnormal findings on diagnostic imaging of other parts of musculoskeletal system: Secondary | ICD-10-CM | POA: Insufficient documentation

## 2013-11-12 DIAGNOSIS — R2241 Localized swelling, mass and lump, right lower limb: Secondary | ICD-10-CM | POA: Insufficient documentation

## 2013-11-12 DIAGNOSIS — M79671 Pain in right foot: Secondary | ICD-10-CM

## 2013-11-12 MED ORDER — GADOBENATE DIMEGLUMINE 529 MG/ML IV SOLN
20.0000 mL | Freq: Once | INTRAVENOUS | Status: AC | PRN
  Administered 2013-11-12: 20 mL via INTRAVENOUS

## 2013-11-20 ENCOUNTER — Ambulatory Visit: Payer: 59

## 2013-11-20 ENCOUNTER — Encounter: Payer: Self-pay | Admitting: Orthopedic Surgery

## 2013-11-20 ENCOUNTER — Ambulatory Visit (INDEPENDENT_AMBULATORY_CARE_PROVIDER_SITE_OTHER): Payer: 59 | Admitting: Orthopedic Surgery

## 2013-11-20 VITALS — BP 111/74 | Ht 77.0 in | Wt 290.0 lb

## 2013-11-20 DIAGNOSIS — M659 Synovitis and tenosynovitis, unspecified: Secondary | ICD-10-CM

## 2013-11-20 DIAGNOSIS — B353 Tinea pedis: Secondary | ICD-10-CM

## 2013-11-20 DIAGNOSIS — M25571 Pain in right ankle and joints of right foot: Secondary | ICD-10-CM

## 2013-11-20 DIAGNOSIS — M65872 Other synovitis and tenosynovitis, left ankle and foot: Secondary | ICD-10-CM

## 2013-11-20 NOTE — Progress Notes (Signed)
Patient ID: Kenneth Zimmerman, male   DOB: 27-Sep-1950, 63 y.o.   MRN: [id number] Chief Complaint  Patient presents with  . Foot Pain    Right foot pain, no injury.    63 year old male presents with painful RIGHT foot. He was seen by the podiatrist. X-rays he says were negative and he had an MRI which shows a area of swelling in the region of the second metatarsophalangeal joint without joint destruction no plantar plate tear was identified there was some soft tissue swelling edema and thickening with fluid collection on the plantar aspect of the foot near the metatarsophalangeal joint of the second metatarsal. Radiologist thought this could be related to pressure lesion or bursal formation from pressure. There was a suggestion of joint aspiration but I find this hard to imagine since the area of swelling is actually plantar to the joint and getting a needle in that joint would be near impossible. There was also suggestion of cellulitis or abscess despite no history of infection or signs of infection.  The patient says his symptoms started after he was walking in regular shoes quite a distance.  He has no evidence of puncture wound he has no surrounding erythema malaise fever chills  He's had this about 8-12 weeks he has responded well to anti-inflammatories and pressure relief from metatarsal padding  Specific to this problem review of systems he denies recent fever chills weight loss or malaise or fatigue all other systems were negative including no evidence of numbness or tingling in the foot skin no rash although he does have some tenia pedis  BP 111/74 mmHg  Ht 6\' 5"  (1.956 m)  Wt 290 lb (131.543 kg)  BMI 34.38 kg/m2  The appearing well-developed well-nourished male oriented 3 mood and affect normal   Is somewhat tender to palpation he has some calluses on the foot the skin shows tenia pedis he does have plantar flexion of the metatarsal heads there is no instability of that area passive  range of motion is normal and there is no pain he is a good pulse in his dorsalis pedis area good capillary refill no sensory deficits  His opposite foot also shows the metatarsal head flexion with a hyperextension of the MTP joints  My interpretation of the MRIs that this is synovitis  I do not think of joint aspiration would be helpful because the swelling is actually outside the joint and trying to aspirate the second metatarsal phalangeal joint without x-ray guidance would be near impossible  Plus he has no signs of infection clinically  Recommend continued padding anti-inflammatories he will see the podiatrist and follow-up. He will ask about a metatarsal pad or formal foot orthotics with relief in the pressure areas of the metatarsal heads

## 2014-05-20 ENCOUNTER — Encounter: Payer: Self-pay | Admitting: Family Medicine

## 2014-05-20 ENCOUNTER — Ambulatory Visit (INDEPENDENT_AMBULATORY_CARE_PROVIDER_SITE_OTHER): Payer: 59 | Admitting: Family Medicine

## 2014-05-20 VITALS — BP 118/88 | Ht 77.0 in

## 2014-05-20 DIAGNOSIS — J209 Acute bronchitis, unspecified: Secondary | ICD-10-CM | POA: Diagnosis not present

## 2014-05-20 MED ORDER — CLARITHROMYCIN 500 MG PO TABS: 500.0000 mg | ORAL_TABLET | Freq: Two times a day (BID) | ORAL | Status: AC

## 2014-05-20 MED ORDER — HYDROCODONE-HOMATROPINE 5-1.5 MG/5ML PO SYRP: 5.0000 mL | ORAL_SOLUTION | Freq: Four times a day (QID) | ORAL | Status: DC | PRN

## 2014-05-20 NOTE — Progress Notes (Signed)
   Subjective:    Patient ID: Kenneth Zimmerman, male    DOB: 12/29/1950, 63 y.o.   MRN: [id number]  HPI  Patient here due to productive cough times 6 days with sore throat. Patient needs something to help control the night cough.   Patient had flu like illness last weekend. Took Tamiflu for it. Improved transiently. Now has definitely settled in the chest. Severe cough day and night worse at night. Positive productive. Review of Systems No vomiting no diarrhea no rash low-grade fever mild malaise    Objective:   Physical Exam Alert vitals stable mild malaise HEENT moderate nasal congestion pharynx normal lungs no wheezes no crackles no tachypnea heart rare rhythm bronchial cough during exam       Assessment & Plan:  Impression post flu bronchitis plan Biaxin 500 twice a day 10 days. Hycodan daily at bedtime when necessary. WSL

## 2014-07-07 ENCOUNTER — Encounter: Payer: Self-pay | Admitting: Orthopedic Surgery

## 2014-07-07 ENCOUNTER — Ambulatory Visit (INDEPENDENT_AMBULATORY_CARE_PROVIDER_SITE_OTHER): Payer: 59

## 2014-07-07 ENCOUNTER — Ambulatory Visit (INDEPENDENT_AMBULATORY_CARE_PROVIDER_SITE_OTHER): Payer: 59 | Admitting: Orthopedic Surgery

## 2014-07-07 VITALS — BP 133/79 | Ht 77.0 in | Wt 286.0 lb

## 2014-07-07 DIAGNOSIS — L03012 Cellulitis of left finger: Secondary | ICD-10-CM

## 2014-07-07 DIAGNOSIS — S60211A Contusion of right wrist, initial encounter: Secondary | ICD-10-CM | POA: Diagnosis not present

## 2014-07-07 DIAGNOSIS — S6991XA Unspecified injury of right wrist, hand and finger(s), initial encounter: Secondary | ICD-10-CM | POA: Diagnosis not present

## 2014-07-07 DIAGNOSIS — S63602A Unspecified sprain of left thumb, initial encounter: Secondary | ICD-10-CM | POA: Diagnosis not present

## 2014-07-07 MED ORDER — DOXYCYCLINE HYCLATE 100 MG PO CAPS: 100.0000 mg | ORAL_CAPSULE | Freq: Two times a day (BID) | ORAL | Status: DC
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
## 2014-07-13 ENCOUNTER — Encounter: Payer: Self-pay | Admitting: Orthopedic Surgery

## 2014-07-13 ENCOUNTER — Ambulatory Visit (INDEPENDENT_AMBULATORY_CARE_PROVIDER_SITE_OTHER): Payer: 59 | Admitting: Orthopedic Surgery

## 2014-07-13 VITALS — BP 131/86 | Ht 77.0 in | Wt 286.0 lb

## 2014-07-13 DIAGNOSIS — L03012 Cellulitis of left finger: Secondary | ICD-10-CM | POA: Diagnosis not present

## 2014-07-13 DIAGNOSIS — S60211A Contusion of right wrist, initial encounter: Secondary | ICD-10-CM | POA: Diagnosis not present

## 2014-07-13 DIAGNOSIS — S63602A Unspecified sprain of left thumb, initial encounter: Secondary | ICD-10-CM | POA: Diagnosis not present

## 2014-07-13 DIAGNOSIS — S62309D Unspecified fracture of unspecified metacarpal bone, subsequent encounter for fracture with routine healing: Secondary | ICD-10-CM | POA: Diagnosis not present

## 2014-07-13 NOTE — Progress Notes (Signed)
Patient ID: ALEC JAROS, male   DOB: 1950-03-11, 64 y.o.   MRN: [id number] Patient ID: DARRNELL MANGIARACINA, male   DOB: December 22, 1950, 64 y.o.   MRN: [id number]  Follow up visit  Chief Complaint  Patient presents with  . Follow-up    6 day follow up right wrist, DOI 07/06/14    BP 131/86 mmHg  Ht 6\' 5"  (1.956 m)  Wt 286 lb (129.729 kg)  BMI 33.91 kg/m2  Encounter Diagnoses  Name Primary?  . Paronychia of left thumb Yes  . Sprain of left thumb, initial encounter   . Contusion, wrist, right, initial encounter   . Metacarpal bone fracture, with routine healing, subsequent encounter     Dr. Johnnye Sima thumb has improved there is no redness drainage she has stopped his anabiotic's. Is right-handed still hurting at the fourth metacarpal bone at the base we re-looked at his x-ray and he seems to have a hairline fracture there is clinical exam bears that out.  Review of systems his abrasions have improved  He is awake alert and oriented 3 mood and affect are normal vital signs are stable  His right hand has full range of motion despite the tenderness the wrist is stable. He has some swelling on the dorsum of the hand and tenderness over the metacarpal phalangeal joint although the x-ray there is negative  There are no neurovascular deficits.  No further follow-ups unless needed his hairline fracture will be treated with skillful neglect and full range of motion no heavy lifting.

## 2014-08-14 ENCOUNTER — Ambulatory Visit (INDEPENDENT_AMBULATORY_CARE_PROVIDER_SITE_OTHER): Payer: 59 | Admitting: Internal Medicine

## 2014-08-14 DIAGNOSIS — Z7184 Encounter for health counseling related to travel: Secondary | ICD-10-CM | POA: Insufficient documentation

## 2014-08-14 DIAGNOSIS — Z7189 Other specified counseling: Secondary | ICD-10-CM

## 2014-08-14 MED ORDER — CIPROFLOXACIN HCL 500 MG PO TABS: 500.0000 mg | ORAL_TABLET | Freq: Two times a day (BID) | ORAL | Status: DC

## 2014-08-14 MED ORDER — DOXYCYCLINE HYCLATE 100 MG PO TABS
100.0000 mg | ORAL_TABLET | Freq: Every day | ORAL | Status: DC
Start: 2014-08-14 — End: 2014-11-19

## 2014-08-14 MED ORDER — DOXYCYCLINE HYCLATE 100 MG PO TABS
100.0000 mg | ORAL_TABLET | Freq: Every day | ORAL | Status: DC
Start: 2014-08-14 — End: 2014-08-14

## 2014-08-14 MED ORDER — TYPHOID VACCINE PO CPDR: 1.0000 | DELAYED_RELEASE_CAPSULE | ORAL | Status: DC

## 2014-08-14 NOTE — Patient Instructions (Signed)
[facility]                [address] [place]                   [place], [place] [address]                      Phone: [phone]                        Fax: [phone]   Planned departure date: September 2016          Planned return date: 1 weeks Countries of travel: [place]   Guidelines for the Prevention & Treatment of Traveler's Diarrhea  Prevention: "Boil it, Peel it, Boyd it, or Forget it"   the fewer chances -> lower risk: try to stick to food & water precautions as much as possible"   If it's "piping hot"; it is probably okay, if not, it may not be   Treatment   1) You should always take care to drink lots of fluids in order to avoid dehydration   2) You should bring medications with you in case you come down with a case of diarrhea   3) OTC = bring pepto-bismol - can take with initial abdominal symptoms;                    Imodium - can help slow down your intestinal tract, can help relief cramps                    and diarrhea, can take if no bloody diarrhea  Use ciprofloxacin if needed for traveler's diarrhea  Guidelines for the Prevention of Malaria  Avoidance:  -fewer mosquito bites = lower risk. Mosquitos can bite at night as well as daytime  -cover up (long sleeve clothing), mosquito nets, screens  -Insect repellent for your skin ( DEET containing lotion > 20%): for clothes ( permethrin spray)   2 days prior to travel,  start doxycycline, daily dose starting 2 days before entering endemic area, ending 4 weeks after leaving area for malaria prevention.   Immunizations received today: Hepatitis A series, Td and Typhoid (oral)  Future immunizations, if indicated Hepatitis A series   Prior to travel:  1) Be sure to pick up appropriate prescriptions, including medicine you take daily. Do not expect to be able to fill your prescriptions abroad.  2) Strongly consider obtaining traveler's  insurance, including emergency evacuation insurance. Most plans in the [place] do not cover participants abroad. (see below for resources)  3) Register at the appropriate [place] embassy or consulate with travel dates so they are aware of your presence in-country and for helpful advice during travel using the [employer] (STEP, GreenNylon.com.cy).  4) Leave contact information with a relative or friend.  5) Keep a Research officer, political party, credit cards in case they become lost or stolen  6) Inform your credit card company that you will be travelling abroad   During travel:  1) If you become ill and need medical advice, the [place] [employer]	[place] of the country you are traveling in general provides a list of [place] speaking doctors.  We are also available on MyChart for remote consultation if you register prior to travel. 2) Avoid motorcycles or scooters when at all possible. Traffic laws in many countries are lax and  accidents occur frequently.  3) Do not take any unnecessary risks that you wouldn't do at home.   Resources:  -Country specific information: BlindResource.ca or GreenNylon.com.cy  -Press photographer (DEET, mosquito nets): [employer], [employer] store, [employer], Chunky insurance options: gatewayplans.com; http://clayton-rivera.info/; travelguard.com or [employer], gninsurance.com or info@gninsurance .com, [id number].   Post Travel:  If you return from your trip ill, call your primary care doctor or our travel clinic @ [phone].   Enjoy your trip and know that with proper pre-travel preparation, most people have an enjoyable and uninterrupted trip!

## 2014-08-14 NOTE — Progress Notes (Signed)
Subjective:   Kenneth Zimmerman is a 64 y.o. male who presents to the Infectious Disease clinic for travel consultation. Planned departure date: September 2016          Planned return date: 1 week Countries of travel: [place] Areas in country: rural   Accommodations: hotel Purpose of travel: health care Prior travel out of [place]: no     Objective:   Medications: reviewed    Assessment:    No contraindications to travel. none     Plan:    Issues discussed: environmental concerns, future shots, insect-borne illnesses, malaria, motion sickness, MVA safety, rabies, safe food/water, traveler's diarrhea, website/handouts for more information, what to do if ill upon return and what to do if ill while there. Immunizations recommended: Hepatitis A series, Td and Typhoid (oral). Malaria prophylaxis: doxycycline, daily dose starting 2 days before entering endemic area, ending 4 weeks after leaving area Traveler's diarrhea prophylaxis: ciprofloxacin. Total duration of visit: 1 Hour. Total time spent on education, counseling, coordination of care: 30 Minutes.

## 2014-08-17 ENCOUNTER — Ambulatory Visit (INDEPENDENT_AMBULATORY_CARE_PROVIDER_SITE_OTHER): Payer: 59 | Admitting: *Deleted

## 2014-08-17 DIAGNOSIS — Z7189 Other specified counseling: Secondary | ICD-10-CM | POA: Diagnosis not present

## 2014-08-17 DIAGNOSIS — Z789 Other specified health status: Secondary | ICD-10-CM

## 2014-08-17 DIAGNOSIS — Z23 Encounter for immunization: Secondary | ICD-10-CM | POA: Diagnosis not present

## 2014-08-17 DIAGNOSIS — Z7184 Encounter for health counseling related to travel: Secondary | ICD-10-CM

## 2014-11-19 ENCOUNTER — Ambulatory Visit (INDEPENDENT_AMBULATORY_CARE_PROVIDER_SITE_OTHER): Payer: 59 | Admitting: Orthopedic Surgery

## 2014-11-19 ENCOUNTER — Ambulatory Visit (INDEPENDENT_AMBULATORY_CARE_PROVIDER_SITE_OTHER): Payer: 59

## 2014-11-19 VITALS — BP 149/91 | Ht 77.0 in | Wt 290.0 lb

## 2014-11-19 DIAGNOSIS — M542 Cervicalgia: Secondary | ICD-10-CM

## 2014-11-19 DIAGNOSIS — M5020 Other cervical disc displacement, unspecified cervical region: Secondary | ICD-10-CM | POA: Diagnosis not present

## 2014-11-19 DIAGNOSIS — M47812 Spondylosis without myelopathy or radiculopathy, cervical region: Secondary | ICD-10-CM

## 2014-11-20 NOTE — Progress Notes (Signed)
Chief Complaint  Patient presents with  . 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 He did have some trauma several weeks back and had some mild right-sided neck pain at that time but this has progressed now to involve the paresthesias he comes in for evaluation and treatment. He did try some Robaxin and an anti-inflammatory with mild relief of symptoms.    Review of systems He does not complain of weakness dizziness or gait disturbance  Past Medical History  Diagnosis Date  . Hyperlipidemia   . ADHD (attention deficit hyperactivity disorder)   . Fatty liver     BP 149/91 mmHg  Ht 6\' 5"  (1.956 m)  Wt 290 lb (131.543 kg)  BMI 34.38 kg/m2 The patient is well-developed well-nourished well-groomed  He is awake alert and oriented 3  Mood and affect are normal  He has no gait disturbance  Cervical spine exam shows he has some midline tenderness no tenderness on the left side but mild tenderness on the right lateral side of his neck in the C5-6 and C6-7 region. There is no atrophy in the shoulder or posterior scapular musculature.  Right shoulder full range of motion no strength deficits no impingement stable in abduction external rotation and no specific tenderness or swelling  Neurologic exam he has normal sensation in the right and left upper extremity using the pinwheel. The reflexes are 2+ and equal at the elbow including C6.  Vascular exam shows normal pulses in the right left upper extremity with normal capillary refill.  Strength examination includes normal grip normal finger extension abduction wrist flexion elbow flexion and elbow extension and deltoid strength in each upper extremity  Medical decision-making was performed with the following  data Imaging x-rays were cervical spine he has mild cervical spondylosis and straightening of his cervical normal curvature.  Diagnosis: New Problem with no further workup planned at this time is cervical spondylosis with probable disc protrusion Encounter Diagnoses  Name Primary?  . Neck pain Yes  . Cervical spondylosis without myelopathy   . Herniated cervical disc     Treatment will include medication as follows  Robaxin 500 mg every 6 Norco 5 mg every 6 when necessary Gabapentin 100 mg 3 times a day 12 mg total strength prednisone Dosepak  Follow-up on Monday

## 2014-11-23 ENCOUNTER — Encounter: Payer: Self-pay | Admitting: Orthopedic Surgery

## 2014-11-23 ENCOUNTER — Ambulatory Visit (INDEPENDENT_AMBULATORY_CARE_PROVIDER_SITE_OTHER): Payer: 59 | Admitting: Orthopedic Surgery

## 2014-11-23 VITALS — BP 135/89 | Ht 77.0 in | Wt 290.0 lb

## 2014-11-23 DIAGNOSIS — M47812 Spondylosis without myelopathy or radiculopathy, cervical region: Secondary | ICD-10-CM

## 2014-11-23 MED ORDER — GABAPENTIN 100 MG PO CAPS: 100.0000 mg | ORAL_CAPSULE | Freq: Three times a day (TID) | ORAL | Status: DC

## 2014-11-23 NOTE — Progress Notes (Signed)
Chief Complaint  Patient presents with  . Follow-up    follow up neck pain    BP 135/89 mmHg  Ht 6\' 5"  (1.956 m)  Wt 290 lb (131.543 kg)  BMI 34.38 kg/m2  Follow-up for cervical spondylosis and numbness in the right thumb  X-rays did show cervical spondylosis and loss of cervical lordosis. The patient was placed on Robaxin Norco gabapentin and prednisone Dosepak. However he only took the Robaxin and the dose pack. He was able to bike for several miles and produced pain in physical activity and says he is significantly improved with only mild numbness depending on position  He has no weakness in his right hand  We have decided to continue with his prednisone Dosepak and he will call me if his symptoms continue and an MRI as needed.

## 2014-11-23 NOTE — Addendum Note (Signed)
Addended by: Carole Civil on: 11/23/2014 09:17 AM   Modules accepted: Orders

## 2014-12-23 ENCOUNTER — Encounter: Payer: Self-pay | Admitting: Orthopedic Surgery

## 2014-12-23 ENCOUNTER — Other Ambulatory Visit: Payer: Self-pay | Admitting: Orthopedic Surgery

## 2014-12-23 ENCOUNTER — Other Ambulatory Visit: Payer: Self-pay | Admitting: *Deleted

## 2014-12-23 DIAGNOSIS — M5020 Other cervical disc displacement, unspecified cervical region: Secondary | ICD-10-CM

## 2014-12-23 NOTE — Progress Notes (Signed)
Patient ID: Kenneth Zimmerman, male   DOB: 1950/06/25, 64 y.o.   MRN: [id number] Dr. Glo Herring has informed me that he cannot sleep more than 2-3 hours per night he has to sleep on his back. If he turns to his right has sharp radicular pain running down to his thumb and index finger. He has to use the computer with his head in a certain position and his back straight or his pain will increase  He did not tolerate Neurontin. He has had NSAIDs including steroids. He has had a reasonable trial of nonoperative conservative care.  At this point we will get an MRI of the cervical spine. Image it for possible epidural injections versus neurosurgical referral for surgery.

## 2014-12-30 ENCOUNTER — Other Ambulatory Visit (HOSPITAL_COMMUNITY): Payer: 59

## 2014-12-30 ENCOUNTER — Ambulatory Visit (HOSPITAL_COMMUNITY)
Admission: RE | Admit: 2014-12-30 | Discharge: 2014-12-30 | Disposition: A | Discharge status: Home / Self Care | Payer: 59 | Source: Ambulatory Visit | Attending: Orthopedic Surgery | Admitting: Orthopedic Surgery

## 2014-12-30 DIAGNOSIS — M47892 Other spondylosis, cervical region: Secondary | ICD-10-CM | POA: Insufficient documentation

## 2014-12-30 DIAGNOSIS — M4802 Spinal stenosis, cervical region: Secondary | ICD-10-CM | POA: Diagnosis not present

## 2014-12-30 DIAGNOSIS — M2578 Osteophyte, vertebrae: Secondary | ICD-10-CM | POA: Diagnosis not present

## 2014-12-30 DIAGNOSIS — M5020 Other cervical disc displacement, unspecified cervical region: Secondary | ICD-10-CM | POA: Insufficient documentation

## 2015-01-12 ENCOUNTER — Telehealth: Payer: Self-pay | Admitting: Orthopedic Surgery

## 2015-01-12 ENCOUNTER — Telehealth: Payer: Self-pay | Admitting: Family Medicine

## 2015-01-12 MED ORDER — HYDROCODONE-HOMATROPINE 5-1.5 MG/5ML PO SYRP: 5.0000 mL | ORAL_SOLUTION | Freq: Four times a day (QID) | ORAL | Status: DC | PRN

## 2015-01-12 NOTE — Telephone Encounter (Signed)
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  Please advise.  His cell ph# is [phone].

## 2015-01-12 NOTE — Telephone Encounter (Signed)
Routing to Dr Harrison 

## 2015-01-12 NOTE — Telephone Encounter (Signed)
No we can make referrla and we need to do so asap

## 2015-01-12 NOTE — Telephone Encounter (Signed)
Ok times one 

## 2015-01-12 NOTE — Telephone Encounter (Signed)
Pt is requesting a refill on his hycodan if possible.

## 2015-01-12 NOTE — Telephone Encounter (Signed)
Called patient and informed him per Dr.Steve Luking- Prescription for Hycodan syrup ready for pick up. Patient verbalized understanding.

## 2015-01-13 ENCOUNTER — Other Ambulatory Visit: Payer: Self-pay | Admitting: *Deleted

## 2015-01-13 DIAGNOSIS — M5030 Other cervical disc degeneration, unspecified cervical region: Secondary | ICD-10-CM

## 2015-01-13 DIAGNOSIS — M5020 Other cervical disc displacement, unspecified cervical region: Secondary | ICD-10-CM

## 2015-01-13 NOTE — Telephone Encounter (Signed)
Referral was faxed to [facility] 01/12/15, patient is aware

## 2015-01-15 NOTE — Telephone Encounter (Signed)
appt 01/18/15 @ 1p with Dr Joya Salm

## 2015-01-18 DIAGNOSIS — M542 Cervicalgia: Secondary | ICD-10-CM | POA: Diagnosis not present

## 2015-01-18 DIAGNOSIS — M4802 Spinal stenosis, cervical region: Secondary | ICD-10-CM | POA: Diagnosis not present

## 2015-02-02 ENCOUNTER — Encounter: Payer: Self-pay | Admitting: Family Medicine

## 2015-02-02 ENCOUNTER — Ambulatory Visit (INDEPENDENT_AMBULATORY_CARE_PROVIDER_SITE_OTHER): Payer: 59 | Admitting: Family Medicine

## 2015-02-02 VITALS — BP 120/84 | Temp 98.4°F | Ht 77.0 in | Wt 285.2 lb

## 2015-02-02 DIAGNOSIS — J329 Chronic sinusitis, unspecified: Secondary | ICD-10-CM | POA: Diagnosis not present

## 2015-02-02 DIAGNOSIS — J029 Acute pharyngitis, unspecified: Secondary | ICD-10-CM

## 2015-02-02 DIAGNOSIS — J310 Chronic rhinitis: Secondary | ICD-10-CM

## 2015-02-02 LAB — POCT RAPID STREP A (OFFICE): Rapid Strep A Screen: NEGATIVE

## 2015-02-02 MED ORDER — AMOXICILLIN-POT CLAVULANATE 875-125 MG PO TABS: 1.0000 | ORAL_TABLET | Freq: Two times a day (BID) | ORAL | Status: AC

## 2015-02-02 NOTE — Progress Notes (Signed)
   Subjective:    Patient ID: Kenneth Zimmerman, male    DOB: September 17, 1950, 65 y.o.   MRN: [id number]  Sore Throat  This is a new problem. The current episode started 1 to 4 weeks ago. Associated symptoms include headaches. Associated symptoms comments: Chills, Muscle Aches, fatigue . Treatments tried: Tylenol, Claritin    Headache frontal worse with change of position persistent cough sore throat. Next  For Kenneth Zimmerman a couple weeks ago got better now has reemerged. Cough productive at times also sore throat worse in the morning next  Diminished energy low-grade fever   Review of Systems  Neurological: Positive for headaches.   No vomiting no diarrhea    Objective:   Physical Exam Alert vitals stable HET moderate his congestion frontal tenderness pharynx erythematous tender anterior nodes neck supple lungs clear heart regular rhythm       Assessment & Plan:  Impression rhinosinusitis likely post viral, discussed with patient. plan antibiotics prescribed. Questions answered. Symptomatic care discussed. warning signs discussed. WSL

## 2015-02-03 LAB — STREP A DNA PROBE: Strep Gp A Direct, DNA Probe: NEGATIVE

## 2015-02-21 ENCOUNTER — Emergency Department (HOSPITAL_COMMUNITY)
Admission: AD | Admit: 2015-02-21 | Discharge: 2015-02-21 | Disposition: A | Discharge status: Home / Self Care | Payer: 59 | Source: Ambulatory Visit | Attending: Family Medicine | Admitting: Family Medicine

## 2015-02-21 ENCOUNTER — Encounter (HOSPITAL_COMMUNITY): Payer: Self-pay | Admitting: *Deleted

## 2015-02-21 DIAGNOSIS — Z7982 Long term (current) use of aspirin: Secondary | ICD-10-CM | POA: Diagnosis not present

## 2015-02-21 DIAGNOSIS — M79661 Pain in right lower leg: Secondary | ICD-10-CM | POA: Diagnosis not present

## 2015-02-21 DIAGNOSIS — M7989 Other specified soft tissue disorders: Secondary | ICD-10-CM | POA: Diagnosis not present

## 2015-02-21 DIAGNOSIS — D6851 Activated protein C resistance: Secondary | ICD-10-CM | POA: Insufficient documentation

## 2015-02-21 HISTORY — DX: Phlebitis and thrombophlebitis of lower extremities, unspecified: I80.3

## 2015-02-21 MED ORDER — ENOXAPARIN SODIUM 150 MG/ML ~~LOC~~ SOLN
130.0000 mg | Freq: Once | SUBCUTANEOUS | Status: AC
  Administered 2015-02-21: 130 mg via SUBCUTANEOUS
  Filled 2015-02-21: qty 0.87

## 2015-02-21 NOTE — MAU Note (Signed)
Pt had just finished performing a C/Seciton and noticed his right calf was shiny and slightly tender.  Measured 2.5cm greater than the left.   Uncomfortable to deep pressure.

## 2015-02-21 NOTE — Discharge Instructions (Signed)
Deep Vein Thrombosis °A deep vein thrombosis (DVT) is a blood clot (thrombus) that usually occurs in a deep, larger vein of the lower leg or the pelvis, or in an upper extremity such as the arm. These are dangerous and can lead to serious and even life-threatening complications if the clot travels to the lungs. °A DVT can damage the valves in your leg veins so that instead of flowing upward, the blood pools in the lower leg. This is called post-thrombotic syndrome, and it can result in pain, swelling, discoloration, and sores on the leg. °CAUSES °A DVT is caused by the formation of a blood clot in your leg, pelvis, or arm. Usually, several things contribute to the formation of blood clots. A clot may develop when: °· Your blood flow slows down. °· Your vein becomes damaged in some way. °· You have a condition that makes your blood clot more easily. °RISK FACTORS °A DVT is more likely to develop in: °· People who are older, especially over 60 years of age. °· People who are overweight (obese). °· People who sit or lie still for a long time, such as during long-distance travel (over 4 hours), bed rest, hospitalization, or during recovery from certain medical conditions like a stroke. °· People who do not engage in much physical activity (sedentary lifestyle). °· People who have chronic breathing disorders. °· People who have a personal or family history of blood clots or blood clotting disease. °· People who have peripheral vascular disease (PVD), diabetes, or some types of cancer. °· People who have heart disease, especially if the person had a recent heart attack or has congestive heart failure. °· People who have neurological diseases that affect the legs (leg paresis). °· People who have had a traumatic injury, such as breaking a hip or leg. °· People who have recently had major or lengthy surgery, especially on the hip, knee, or abdomen. °· People who have had a central line placed inside a large vein. °· People  who take medicines that contain the hormone estrogen. These include birth control pills and hormone replacement therapy. °· Pregnancy or during childbirth or the postpartum period. °· Long plane flights (over 8 hours). °SIGNS AND SYMPTOMS °Symptoms of a DVT can include:  °· Swelling of your leg or arm, especially if one side is much worse. °· Warmth and redness of your leg or arm, especially if one side is much worse. °· Pain in your arm or leg. If the clot is in your leg, symptoms may be more noticeable or worse when you stand or walk. °· A feeling of pins and needles, if the clot is in the arm. °The symptoms of a DVT that has traveled to the lungs (pulmonary embolism, PE) usually start suddenly and include: °· Shortness of breath while active or at rest. °· Coughing or coughing up blood or blood-tinged mucus. °· Chest pain that is often worse with deep breaths. °· Rapid or irregular heartbeat. °· Feeling light-headed or dizzy. °· Fainting. °· Feeling anxious. °· Sweating. °There may also be pain and swelling in a leg if that is where the blood clot started. °These symptoms may represent a serious problem that is an emergency. Do not wait to see if the symptoms will go away. Get medical help right away. Call your local emergency services (911 in the [place] Do not drive yourself to the hospital. °DIAGNOSIS °Your health care provider will take a medical history and perform a physical exam. You may also   have other tests, including: °· Blood tests to assess the clotting properties of your blood. °· Imaging tests, such as CT, ultrasound, MRI, X-ray, and other tests to see if you have clots anywhere in your body. °TREATMENT °After a DVT is identified, it can be treated. The type of treatment that you receive depends on many factors, such as the cause of your DVT, your risk for bleeding or developing more clots, and other medical conditions that you have. Sometimes, a combination of treatments is necessary. Treatment  options may be combined and include: °· Monitoring the blood clot with ultrasound. °· Taking medicines by mouth, such as newer blood thinners (anticoagulants), thrombolytics, or warfarin. °· Taking anticoagulant medicine by injection or through an IV tube. °· Wearing compression stockings or using different types of devices. °· Surgery (rare) to remove the blood clot or to place a filter in your abdomen to stop the blood clot from traveling to your lungs. °Treatments for a DVT are often divided into immediate treatment and long-term treatment (up to 3 months after DVT). You can work with your health care provider to choose the treatment program that is best for you. °HOME CARE INSTRUCTIONS °If you are taking a newer oral anticoagulant: °· Take the medicine every single day at the same time each day. °· Understand what foods and drugs interact with this medicine. °· Understand that there are no regular blood tests required when using this medicine. °· Understand the side effects of this medicine, including excessive bruising or bleeding. Ask your health care provider or pharmacist about other possible side effects. °If you are taking warfarin: °· Understand how to take warfarin and know which foods can affect how warfarin works in your body. °· Understand that it is dangerous to take too much or too little warfarin. Too much warfarin increases the risk of bleeding. Too little warfarin continues to allow the risk for blood clots. °· Follow your PT and INR blood testing schedule. The PT and INR results allow your health care provider to adjust your dose of warfarin. It is very important that you have your PT and INR tested as often as told by your health care provider. °· Avoid major changes in your diet, or tell your health care provider before you change your diet. Arrange a visit with a registered dietitian to answer your questions. Many foods, especially foods that are high in vitamin K, can interfere with warfarin  and affect the PT and INR results. Eat a consistent amount of foods that are high in vitamin K, such as: °¨ Spinach, kale, broccoli, cabbage, collard greens, turnip greens, Brussels sprouts, peas, cauliflower, seaweed, and parsley. °¨ Beef liver and pork liver. °¨ Green tea. °¨ Soybean oil. °· Tell your health care provider about any and all medicines, vitamins, and supplements that you take, including aspirin and other over-the-counter anti-inflammatory medicines. Be especially cautious with aspirin and anti-inflammatory medicines. Do not take those before you ask your health care provider if it is safe to do so. This is important because many medicines can interfere with warfarin and affect the PT and INR results. °· Do not start or stop taking any over-the-counter or prescription medicine unless your health care provider or pharmacist tells you to do so. °If you take warfarin, you will also need to do these things: °· Hold pressure over cuts for longer than usual. °· Tell your dentist and other health care providers that you are taking warfarin before you have any procedures in which   bleeding may occur. °· Avoid alcohol or drink very small amounts. Tell your health care provider if you change your alcohol intake. °· Do not use tobacco products, including cigarettes, chewing tobacco, and e-cigarettes. If you need help quitting, ask your health care provider. °· Avoid contact sports. °General Instructions °· Take over-the-counter and prescription medicines only as told by your health care provider. Anticoagulant medicines can have side effects, including easy bruising and difficulty stopping bleeding. If you are prescribed an anticoagulant, you will also need to do these things: °¨ Hold pressure over cuts for longer than usual. °¨ Tell your dentist and other health care providers that you are taking anticoagulants before you have any procedures in which bleeding may occur. °¨ Avoid contact sports. °· Wear a medical  alert bracelet or carry a medical alert card that says you have had a PE. °· Ask your health care provider how soon you can go back to your normal activities. Stay active to prevent new blood clots from forming. °· Make sure to exercise while traveling or when you have been sitting or standing for a long period of time. It is very important to exercise. Exercise your legs by walking or by tightening and relaxing your leg muscles often. Take frequent walks. °· Wear compression stockings as told by your health care provider to help prevent more blood clots from forming. °· Do not use tobacco products, including cigarettes, chewing tobacco, and e-cigarettes. If you need help quitting, ask your health care provider. °· Keep all follow-up appointments with your health care provider. This is important. °PREVENTION °Take these actions to decrease your risk of developing another DVT: °· Exercise regularly. For at least 30 minutes every day, engage in: °¨ Activity that involves moving your arms and legs. °¨ Activity that encourages good blood flow through your body by increasing your heart rate. °· Exercise your arms and legs every hour during long-distance travel (over 4 hours). Drink plenty of water and avoid drinking alcohol while traveling. °· Avoid sitting or lying in bed for long periods of time without moving your legs. °· Maintain a weight that is appropriate for your height. Ask your health care provider what weight is healthy for you. °· If you are a woman who is over 35 years of age, avoid unnecessary use of medicines that contain estrogen. These include birth control pills. °· Do not smoke, especially if you take estrogen medicines. If you need help quitting, ask your health care provider. °If you are hospitalized, prevention measures may include: °· Early walking after surgery, as soon as your health care provider says that it is safe. °· Receiving anticoagulants to prevent blood clots. If you cannot take  anticoagulants, other options may be available, such as wearing compression stockings or using different types of devices. °SEEK IMMEDIATE MEDICAL CARE IF: °· You have new or increased pain, swelling, or redness in an arm or leg. °· You have numbness or tingling in an arm or leg. °· You have shortness of breath while active or at rest. °· You have chest pain. °· You have a rapid or irregular heartbeat. °· You feel light-headed or dizzy. °· You cough up blood. °· You notice blood in your vomit, bowel movement, or urine. °These symptoms may represent a serious problem that is an emergency. Do not wait to see if the symptoms will go away. Get medical help right away. Call your local emergency services (911 in the [place] Do not drive yourself to the hospital. °  °  This information is not intended to replace advice given to you by your health care provider. Make sure you discuss any questions you have with your health care provider. °  °Document Released: 12/19/2004 Document Revised: 09/09/2014 Document Reviewed: 04/15/2014 °Elsevier Interactive Patient Education ©2016 [employer] ° °

## 2015-02-21 NOTE — MAU Provider Note (Signed)
  History     CSN: [id number]  Arrival date and time:     None     No chief complaint on file.  HPI Comments: Kenneth Zimmerman is a 65 y.o who presents today with right calf pain and swelling. He states for about 2 weeks he has had pain, and he thought it was from his knee. It has gotten worse, and in the last 72 hours the pain has been different. He is heterozygous for factor V Leiden, and has hx of saphenous vein clot in the left leg.   Leg Pain  The incident occurred 12 to 24 hours ago. There was no injury mechanism. The pain is present in the right leg (right calf ). The pain is at a severity of 5/10. He has tried elevation and NSAIDs for the symptoms.   Past Medical History  Diagnosis Date  . Hyperlipidemia   . ADHD (attention deficit hyperactivity disorder)   . Fatty liver     No past surgical history on file.  Family History  Problem Relation Age of Onset  . Diabetes Father   . Diabetes Brother   . Cancer Other     colon    Social History  Substance Use Topics  . Smoking status: Never Smoker   . Smokeless tobacco: Never Used  . Alcohol Use: Not on file    Allergies: No Known Allergies  Prescriptions prior to admission  Medication Sig Dispense Refill Last Dose  . aspirin 81 MG tablet Take 81 mg by mouth daily.   Taking  . gabapentin (NEURONTIN) 100 MG capsule Take 1 capsule (100 mg total) by mouth 3 (three) times daily. (Patient not taking: Reported on 02/02/2015)   Not Taking  . Hydrocodone-Acetaminophen (VICODIN PO) Take by mouth. Reported on 02/02/2015   Not Taking  . HYDROcodone-homatropine (HYCODAN) 5-1.5 MG/5ML syrup Take 5 mLs by mouth every 6 (six) hours as needed for cough. (Patient not taking: Reported on 02/02/2015) 120 mL 0 Not Taking  . Methocarbamol (ROBAXIN PO) Take by mouth. Reported on 02/02/2015   Not Taking  . PREDNISONE, PAK, PO Take by mouth. Reported on 02/02/2015   Not Taking    ROS Physical Exam   There were no vitals taken for this  visit.  Physical Exam  Nursing note and vitals reviewed. Constitutional: He is oriented to person, place, and time. He appears well-developed and well-nourished. No distress.  HENT:  Head: Normocephalic.  Cardiovascular: Normal rate.   Respiratory: Effort normal.  Musculoskeletal: He exhibits edema and tenderness.  Right calf: 50 cm, edema, redness and tenderness  Left calf: 47.5 cm   Neurological: He is alert and oriented to person, place, and time.    MAU Course  Procedures  MDM Given therapeutic dose of lovenox here today.  FU for venous duplex at [facility] tomorrow in the morning, order in Epic   Assessment and Plan   1. Calf swelling    DC home RX: none, lovenox given here today  Warning signs reviewed   Follow-up Information    Follow up with [facility].   Contact information:   [address] [phone] [id number]        Mathis Bud 02/21/2015, 8:55 PM

## 2015-02-22 ENCOUNTER — Ambulatory Visit (HOSPITAL_COMMUNITY)
Admission: RE | Admit: 2015-02-22 | Discharge: 2015-02-22 | Disposition: A | Discharge status: Home / Self Care | Payer: 59 | Source: Ambulatory Visit | Attending: Advanced Practice Midwife | Admitting: Advanced Practice Midwife

## 2015-02-22 ENCOUNTER — Ambulatory Visit: Payer: 59 | Admitting: Family Medicine

## 2015-02-22 DIAGNOSIS — R600 Localized edema: Secondary | ICD-10-CM | POA: Insufficient documentation

## 2015-02-22 DIAGNOSIS — M7989 Other specified soft tissue disorders: Secondary | ICD-10-CM

## 2015-06-09 DIAGNOSIS — H524 Presbyopia: Secondary | ICD-10-CM | POA: Diagnosis not present

## 2015-07-16 ENCOUNTER — Ambulatory Visit (INDEPENDENT_AMBULATORY_CARE_PROVIDER_SITE_OTHER): Payer: 59 | Admitting: Family Medicine

## 2015-07-16 ENCOUNTER — Encounter: Payer: Self-pay | Admitting: Family Medicine

## 2015-07-16 VITALS — BP 118/76 | Temp 98.0°F | Ht 77.0 in | Wt 266.2 lb

## 2015-07-16 DIAGNOSIS — J208 Acute bronchitis due to other specified organisms: Secondary | ICD-10-CM

## 2015-07-16 DIAGNOSIS — G4719 Other hypersomnia: Secondary | ICD-10-CM

## 2015-07-16 MED ORDER — HYDROCODONE-HOMATROPINE 5-1.5 MG/5ML PO SYRP: 5.0000 mL | ORAL_SOLUTION | Freq: Four times a day (QID) | ORAL | Status: DC | PRN

## 2015-07-16 MED ORDER — AZITHROMYCIN 250 MG PO TABS: ORAL_TABLET | ORAL | Status: DC

## 2015-07-16 NOTE — Progress Notes (Addendum)
   Subjective:    Patient ID: Kenneth Kind., male    DOB: 02-25-1950, 65 y.o.   MRN: [id number]  Cough This is a new problem. The current episode started 1 to 4 weeks ago. Treatments tried: Jeannette How, Hycodan  The treatment provided no relief.   Patient states he did cough up some phlegm that had a slight redness to one edge of it that he thinks is just from the severity of the cough that is about a week ago has not happened since. Patient denies any other particular troubles.  Review of Systems  Respiratory: Positive for cough.    Denies fevers chills cough    Objective:   Physical Exam  Lungs clear hearts regular HEENT benign No crackles no wheezes     Assessment & Plan:  Cough-more than likely viral bronchitis off on antibiotics currently warning signs were discussed prescription for antibiotics given encases does take a wicked turn. No sign and pneumonia don't recommend an x-ray if this is not gone away within 2 weeks I recommend patient to follow-up. May need x-rays at that point in time. Patient request cough suppressant this may be using nighttime not to be used during the day time avoid driving with this medicine. Patient was told that if this is not gone within 2 weeks he must get an x-ray.  Patient does relate daytime sleepiness. I gave him a sleep questionnaire to fill out. I believe the patient would benefit from a consultation with a sleep specialist perhaps Dr. Keturah Barre as well as a sleep study may have narcolepsy as well based on his description. Hold off on any medications until this is further clarified  Patient was warned not to drive if sleepy

## 2015-09-09 ENCOUNTER — Telehealth: Payer: Self-pay | Admitting: *Deleted

## 2015-09-09 ENCOUNTER — Other Ambulatory Visit: Payer: Self-pay | Admitting: *Deleted

## 2015-09-09 MED ORDER — CIPROFLOXACIN HCL 500 MG PO TABS: 500.0000 mg | ORAL_TABLET | Freq: Two times a day (BID) | ORAL | 0 refills | Status: DC

## 2015-09-09 NOTE — Telephone Encounter (Signed)
thanks

## 2015-09-09 NOTE — Telephone Encounter (Signed)
Patient's mission trip 2016 was postponed until 09/2015 due to his wife's health changes.  Trip is the same length and location as last year's planned mission trip.  Pt took the oral typhoid and filled the doxycycline as directed (still has 37 pills for a 7 day trip).  He would like a refill of the cipro prescription for traveler's diarrhea if possible. Patient coming 9/8 for Hep A#2.  Please advise if ok to refill Cipro. Landis Gandy, RN

## 2015-09-09 NOTE — Telephone Encounter (Signed)
ok 

## 2015-09-10 ENCOUNTER — Ambulatory Visit (INDEPENDENT_AMBULATORY_CARE_PROVIDER_SITE_OTHER): Payer: 59 | Admitting: *Deleted

## 2015-09-10 DIAGNOSIS — Z7189 Other specified counseling: Secondary | ICD-10-CM | POA: Diagnosis not present

## 2015-09-10 DIAGNOSIS — Z23 Encounter for immunization: Secondary | ICD-10-CM | POA: Diagnosis not present

## 2015-09-10 DIAGNOSIS — Z0000 Encounter for general adult medical examination without abnormal findings: Secondary | ICD-10-CM

## 2015-09-10 DIAGNOSIS — Z9189 Other specified personal risk factors, not elsewhere classified: Secondary | ICD-10-CM | POA: Diagnosis not present

## 2015-09-10 DIAGNOSIS — Z789 Other specified health status: Secondary | ICD-10-CM | POA: Diagnosis not present

## 2015-09-10 DIAGNOSIS — IMO0002 Reserved for concepts with insufficient information to code with codable children: Secondary | ICD-10-CM

## 2016-03-15 ENCOUNTER — Other Ambulatory Visit (HOSPITAL_COMMUNITY)
Admission: RE | Admit: 2016-03-15 | Discharge: 2016-03-15 | Disposition: A | Discharge status: Home / Self Care | Payer: 59 | Source: Ambulatory Visit | Attending: Family Medicine | Admitting: Family Medicine

## 2016-03-15 ENCOUNTER — Telehealth: Payer: Self-pay | Admitting: Family Medicine

## 2016-03-15 ENCOUNTER — Ambulatory Visit (INDEPENDENT_AMBULATORY_CARE_PROVIDER_SITE_OTHER): Payer: 59 | Admitting: Family Medicine

## 2016-03-15 ENCOUNTER — Ambulatory Visit (HOSPITAL_COMMUNITY)
Admission: RE | Admit: 2016-03-15 | Discharge: 2016-03-15 | Disposition: A | Discharge status: Home / Self Care | Payer: 59 | Source: Ambulatory Visit | Attending: Family Medicine | Admitting: Family Medicine

## 2016-03-15 DIAGNOSIS — M7989 Other specified soft tissue disorders: Principal | ICD-10-CM

## 2016-03-15 DIAGNOSIS — I82402 Acute embolism and thrombosis of unspecified deep veins of left lower extremity: Secondary | ICD-10-CM | POA: Diagnosis not present

## 2016-03-15 DIAGNOSIS — M79662 Pain in left lower leg: Secondary | ICD-10-CM | POA: Insufficient documentation

## 2016-03-15 DIAGNOSIS — M7122 Synovial cyst of popliteal space [Baker], left knee: Secondary | ICD-10-CM | POA: Diagnosis not present

## 2016-03-15 LAB — D-DIMER, QUANTITATIVE: D-Dimer, Quant: 14.51 ug/mL-FEU — ABNORMAL HIGH (ref 0.00–0.50)

## 2016-03-15 MED ORDER — PANTOPRAZOLE SODIUM 40 MG PO TBEC: 40.0000 mg | DELAYED_RELEASE_TABLET | Freq: Every day | ORAL | 3 refills | Status: DC

## 2016-03-15 NOTE — Telephone Encounter (Signed)
Last seen 01/2015 for sore throat

## 2016-03-15 NOTE — Telephone Encounter (Signed)
Doppler u s AND d dimer

## 2016-03-15 NOTE — Telephone Encounter (Signed)
STAT D-dimer and venous ultrasound of left leg scheduled at [facility] today

## 2016-03-15 NOTE — Addendum Note (Signed)
Addended by: Sallee Lange A on: 03/15/2016 08:06 PM   Modules accepted: Level of Service

## 2016-03-15 NOTE — Progress Notes (Signed)
Patient presents today with a three-day history of having significant pain and discomfort in his left leg in the calf that radiates up behind his knee. Patient denies any chest pressure tightness or shortness of breath. Denies any wheezing or difficulty breathing. Patient relates that he has been on his feet a lot. 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 Patient does have a history a DVT. Also patient has a history of heterozygous factor V Leiden mutation. He also has moderate history of Baker's cyst left knee and a history of repetitive phlebitis of superficial veins and superficial thrombophlebitis. Patient does not have any history of cancer denies any injury. Blood pressure 130/80 heart rate controlled respiratory rate is normal lungs are clear no crackle no respiratory distress heart regular no tachycardia Extremities has some mild tenderness and swelling of the left lower extremity. Left lower extremity 1-1/2 inches greater than the right side I do not see evidence of phlegmasia he does have some slight reddish discoloration on the surface of the anterior portion of the lower leg but no significant vascular compromise DVT ultrasound is noted including occlusive DVT in the calf and popliteal veins and partially occlusive in the femoral vein Elevated d-dimer is noted Discussion was held with the patient at length about the results of this and the need for treatment Discussion was also held with hematologist on call [facility]. Irene Limbo regarding treatment options It was felt that the most beneficial treatment would be Xarelto but thrombolytics could be considered. This was discussed with Dr. Glo Herring. Dr. Glo Herring stated that he will call the oncologist hematologist to discuss further to try to decide between the 2 therapies. He will let us know. Prescription for Xarelto was written so if he decides against thrombolytics he will start the medicine tonight 15 mg twice  daily for the next 21 days then after that 20 mg once daily. It was advised to the patient that he will need to do a follow-up office visit plus also advised to the patient that consultation with hematology is recommended to determine long-term anticoagulation to prevent blood clots The patient was counseled regarding the risk and benefits of treatment and nontreatment. He was counseled regarding risk and benefits of using Coumadin versus Xarelto versus Eliquis The patient is aware of the risk of bleeding but is aware that the risk of blood clots outweigh the risk of bleeding and has opted to start Xarelto. He will let us know if any ongoing issues. He will let us know which treatment he decides. The patient is also aware to immediately go to the hospital if he starts developing any type of sharp chest pains cough if he starts developing difficulty breathing or any emergent situation call [phone] minutes spent with patient additional time spent with speaking with oncology been speaking with the patient again

## 2016-03-15 NOTE — Telephone Encounter (Signed)
Pt is experiencing calf swelling and is requesting a doppler ultrasound to be ordered so that the results will be available for his appointment tomorrow morning.

## 2016-03-16 ENCOUNTER — Telehealth: Payer: Self-pay | Admitting: Family Medicine

## 2016-03-16 ENCOUNTER — Ambulatory Visit: Payer: 59 | Admitting: Family Medicine

## 2016-03-16 MED ORDER — RIVAROXABAN (XARELTO) VTE STARTER PACK (15 & 20 MG): ORAL_TABLET | ORAL | 0 refills | Status: DC

## 2016-03-16 MED ORDER — RIVAROXABAN 20 MG PO TABS: 20.0000 mg | ORAL_TABLET | Freq: Every day | ORAL | 0 refills | Status: DC

## 2016-03-16 NOTE — Addendum Note (Signed)
Addended by: Dairl Ponder on: 03/16/2016 11:32 AM   Modules accepted: Orders

## 2016-03-16 NOTE — Telephone Encounter (Signed)
Nurse's-I did prescribe patient Xarelto 15 mg twice daily for 21 days on a prescription. Please put this on his med list. Also go ahead and send in Xarelto 20 mg, #90, no refills, 1 daily-[facility] Please also have the front notify Dr. Glo Herring to set up a follow-up office visit with Dr. Richardson Landry in 3-4 weeks. I have talked with the patient he is aware that after using the 15 mg twice daily he will start the 20 mg once daily he is also aware that he will need to follow-up within 3-4 weeks but I feel we need to take the initiative to schedule this or at least have the front notify him for this so that he does not forget

## 2016-03-16 NOTE — Telephone Encounter (Signed)
Prescription sent electronically to pharmacy. Front to call patient and schedule follow up office visit

## 2016-04-03 ENCOUNTER — Other Ambulatory Visit: Payer: Self-pay

## 2016-04-03 DIAGNOSIS — I82409 Acute embolism and thrombosis of unspecified deep veins of unspecified lower extremity: Secondary | ICD-10-CM | POA: Diagnosis not present

## 2016-04-03 NOTE — Progress Notes (Unsigned)
.  ip

## 2016-04-04 LAB — LIPID PANEL
Chol/HDL Ratio: 3.7 ratio (ref 0.0–5.0)
Cholesterol, Total: 191 mg/dL (ref 100–199)
HDL: 51 mg/dL (ref >39)
LDL CALC: 126 mg/dL — AB (ref 0–99)
Triglycerides: 71 mg/dL (ref 0–149)
VLDL CHOLESTEROL CAL: 14 mg/dL (ref 5–40)

## 2016-04-07 ENCOUNTER — Encounter: Payer: Self-pay | Admitting: Family Medicine

## 2016-04-07 ENCOUNTER — Ambulatory Visit (INDEPENDENT_AMBULATORY_CARE_PROVIDER_SITE_OTHER): Payer: 59 | Admitting: Family Medicine

## 2016-04-07 VITALS — Ht 77.0 in | Wt 275.4 lb

## 2016-04-07 DIAGNOSIS — I82402 Acute embolism and thrombosis of unspecified deep veins of left lower extremity: Secondary | ICD-10-CM

## 2016-04-07 DIAGNOSIS — D6851 Activated protein C resistance: Secondary | ICD-10-CM

## 2016-04-07 NOTE — Progress Notes (Signed)
   Subjective:    Patient ID: Kenneth Kind., male    DOB: [birth year], 66 y.o.   MRN: [id number]  HPI Patient arrives for a follow up on DVT-currently on Xarelto.  Results for orders placed or performed in visit on 04/03/16  Lipid panel  Result Value Ref Range   Cholesterol, Total 191 100 - 199 mg/dL   Triglycerides 71 0 - 149 mg/dL   HDL 51 >39 mg/dL   VLDL Cholesterol Cal 14 5 - 40 mg/dL   LDL Calculated 126 (H) 0 - 99 mg/dL   Chol/HDL Ratio 3.7 0.0 - 5.0 ratio  Medicine well no obvious side effects  Trial of ptooix for reflux and night tie hoarseness  Review of Systems No headache, no major weight loss or weight gain, no chest pain no back pain abdominal pain no change in bowel habits complete ROS otherwise negative     Objective:   Physical Exam  Alert vitals stable, NAD. Blood pressure good on repeat. HEENT normal. Lungs clear. Heart regular rate and rhythm.       Assessment & Plan:  Impression recurrent DVT/superficial thrombophlebitis equivalent with known factor V Leyden deficiency Long discussion held patient willing to maintain Xarelto which I think is a good idea

## 2016-04-08 MED ORDER — PANTOPRAZOLE SODIUM 40 MG PO TBEC: 40.0000 mg | DELAYED_RELEASE_TABLET | Freq: Every day | ORAL | 3 refills | Status: DC

## 2016-04-08 MED ORDER — RIVAROXABAN 20 MG PO TABS: 20.0000 mg | ORAL_TABLET | Freq: Every day | ORAL | 2 refills | Status: DC

## 2016-04-10 ENCOUNTER — Other Ambulatory Visit: Payer: Self-pay | Admitting: *Deleted

## 2016-04-10 MED ORDER — SILDENAFIL CITRATE 20 MG PO TABS: ORAL_TABLET | ORAL | 5 refills | Status: DC

## 2016-04-10 NOTE — Progress Notes (Signed)
silden

## 2016-04-24 ENCOUNTER — Telehealth: Payer: Self-pay | Admitting: *Deleted

## 2016-04-24 NOTE — Telephone Encounter (Signed)
Patient called and advised he will be traveling 05/09/16 to [place] and needs Rx for travel meds like last time. Advised him Clifton Custard has gone for the day but will have her call first thing in the morning to see exactly what he needs.

## 2016-04-25 NOTE — Telephone Encounter (Signed)
Called patient, left message.  Per Dr Linus Salmons, ok to refill if patient is going to the same location. Per Dr Linus Salmons, patient will need cipro 500 mg BID for traveler's diarrhea and doxycycline 100 mg daily, starting 2 days before travel, continuing daily through 4 weeks after return.  Left message asking him for travel destination, length of trip, and pharmacy preference. Landis Gandy, RN

## 2016-05-02 ENCOUNTER — Other Ambulatory Visit: Payer: Self-pay | Admitting: *Deleted

## 2016-05-02 DIAGNOSIS — Z9189 Other specified personal risk factors, not elsewhere classified: Secondary | ICD-10-CM

## 2016-05-02 DIAGNOSIS — Z7184 Encounter for health counseling related to travel: Secondary | ICD-10-CM

## 2016-05-02 MED ORDER — CIPROFLOXACIN HCL 500 MG PO TABS: 500.0000 mg | ORAL_TABLET | Freq: Two times a day (BID) | ORAL | 0 refills | Status: DC

## 2016-05-02 MED ORDER — DOXYCYCLINE HYCLATE 100 MG PO TABS: 100.0000 mg | ORAL_TABLET | Freq: Every day | ORAL | 0 refills | Status: DC

## 2016-05-02 NOTE — Telephone Encounter (Signed)
Patient came into clinic to follow up on refill request.  Per patient, the only medication difference is the addition of xarelto.  Patient traveling to same location in [place] 7 day stay.  Doxycycline and Cipro refills sent to [facility]. Landis Gandy, RN

## 2016-07-13 ENCOUNTER — Ambulatory Visit: Payer: 59 | Admitting: Family Medicine

## 2016-07-20 ENCOUNTER — Ambulatory Visit: Payer: 59 | Admitting: Family Medicine

## 2016-07-24 ENCOUNTER — Ambulatory Visit (INDEPENDENT_AMBULATORY_CARE_PROVIDER_SITE_OTHER): Payer: 59 | Admitting: Family Medicine

## 2016-07-24 ENCOUNTER — Encounter: Payer: Self-pay | Admitting: Family Medicine

## 2016-07-24 VITALS — BP 138/86 | Ht 77.0 in | Wt 278.6 lb

## 2016-07-24 DIAGNOSIS — I82402 Acute embolism and thrombosis of unspecified deep veins of left lower extremity: Secondary | ICD-10-CM

## 2016-07-24 DIAGNOSIS — R490 Dysphonia: Secondary | ICD-10-CM | POA: Diagnosis not present

## 2016-07-24 DIAGNOSIS — M79662 Pain in left lower leg: Secondary | ICD-10-CM

## 2016-07-24 DIAGNOSIS — D6851 Activated protein C resistance: Secondary | ICD-10-CM

## 2016-07-24 DIAGNOSIS — M7989 Other specified soft tissue disorders: Secondary | ICD-10-CM | POA: Diagnosis not present

## 2016-07-24 NOTE — Progress Notes (Signed)
   Subjective:    Patient ID: Kenneth Kind., male    DOB: 1950-09-12, 66 y.o.   MRN: [id number]  HPI Patient arrives office for protracted discussion. Patient arrives for follow up on DVT. See prior notes. Has had one treated DVT and to deep superficial DVTs. This along with a Leiden deficiency heterozygous. All added up this patient qualifies for long lifelong anticoagulation. Discussed once again.   Left leg, overall feels good. There is some swelling intermittently. Trying to wear socks faithfully. Recalls no recent injury.  ewearing the sockong faithfull   . Patient also with c/o continued hoarseness. Trial of proton pump inhibitor really did not affected all. Often very hoarse but in the day. Review of Systems No headache, no major weight loss or weight gain, no chest pain no back pain abdominal pain no change in bowel habits complete ROS otherwise negative     Objective:   Physical Exam  Alert and oriented, vitals reviewed and stable, NAD ENT-TM's and ext canals WNL bilat via otoscopic exam/Voice somewhat hoarse Soft palate, tonsils and post pharynx WNL via oropharyngeal exam Neck-symmetric, no masses; thyroid nonpalpable and nontender Pulmonary-no tachypnea or accessory muscle use; Clear without wheezes via auscultation Card--no abnrml murmurs, rhythm reg and rate WNL Carotid pulses symmetric, without bruits Venous stasis evident left leg trace to 1+ trace on right.      Assessment & Plan:  Impression persistent hoarseness #2 venous stasis discussed #3 anticoagulant choice discussed  Greater than 50% of this 25 minute face to face visit was spent in counseling and discussion and coordination of care regarding the above diagnosis/diagnosies

## 2016-08-28 ENCOUNTER — Ambulatory Visit (INDEPENDENT_AMBULATORY_CARE_PROVIDER_SITE_OTHER): Payer: 59 | Admitting: Otolaryngology

## 2016-08-28 DIAGNOSIS — J382 Nodules of vocal cords: Secondary | ICD-10-CM | POA: Diagnosis not present

## 2016-08-28 DIAGNOSIS — R490 Dysphonia: Secondary | ICD-10-CM

## 2016-08-28 DIAGNOSIS — K219 Gastro-esophageal reflux disease without esophagitis: Secondary | ICD-10-CM | POA: Diagnosis not present

## 2016-10-17 ENCOUNTER — Encounter: Payer: Self-pay | Admitting: Orthopaedic Surgery

## 2016-10-17 ENCOUNTER — Ambulatory Visit (INDEPENDENT_AMBULATORY_CARE_PROVIDER_SITE_OTHER): Payer: 59 | Admitting: Orthopaedic Surgery

## 2016-10-17 ENCOUNTER — Ambulatory Visit (INDEPENDENT_AMBULATORY_CARE_PROVIDER_SITE_OTHER): Payer: 59

## 2016-10-17 ENCOUNTER — Ambulatory Visit (HOSPITAL_COMMUNITY)
Admission: RE | Admit: 2016-10-17 | Discharge: 2016-10-17 | Disposition: A | Discharge status: Home / Self Care | Payer: 59 | Source: Ambulatory Visit | Attending: Orthopaedic Surgery | Admitting: Orthopaedic Surgery

## 2016-10-17 VITALS — BP 126/82 | HR 63 | Ht 77.0 in | Wt 284.0 lb

## 2016-10-17 DIAGNOSIS — M25471 Effusion, right ankle: Secondary | ICD-10-CM | POA: Insufficient documentation

## 2016-10-17 DIAGNOSIS — M79671 Pain in right foot: Secondary | ICD-10-CM

## 2016-10-17 DIAGNOSIS — M7989 Other specified soft tissue disorders: Secondary | ICD-10-CM | POA: Diagnosis not present
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
## 2016-11-01 DIAGNOSIS — J383 Other diseases of vocal cords: Secondary | ICD-10-CM | POA: Diagnosis not present

## 2016-11-01 DIAGNOSIS — R490 Dysphonia: Secondary | ICD-10-CM | POA: Diagnosis not present

## 2016-11-15 DIAGNOSIS — J383 Other diseases of vocal cords: Secondary | ICD-10-CM | POA: Insufficient documentation

## 2016-11-15 DIAGNOSIS — R490 Dysphonia: Secondary | ICD-10-CM | POA: Insufficient documentation

## 2017-01-11 ENCOUNTER — Encounter: Payer: Self-pay | Admitting: Family Medicine

## 2017-01-11 ENCOUNTER — Ambulatory Visit: Payer: 59 | Admitting: Family Medicine

## 2017-01-11 VITALS — BP 132/82 | Ht 77.0 in | Wt 289.4 lb

## 2017-01-11 DIAGNOSIS — D6851 Activated protein C resistance: Secondary | ICD-10-CM | POA: Diagnosis not present

## 2017-01-11 DIAGNOSIS — G4719 Other hypersomnia: Secondary | ICD-10-CM

## 2017-01-11 DIAGNOSIS — I82402 Acute embolism and thrombosis of unspecified deep veins of left lower extremity: Secondary | ICD-10-CM | POA: Diagnosis not present

## 2017-01-11 NOTE — Progress Notes (Signed)
   Subjective:    Patient ID: Kenneth Kind., male    DOB: 08/03/1950, 67 y.o.   MRN: [id number] Patient arrives with numerous concerns. Hyperlipidemia  This is a chronic problem. The current episode started more than 1 year ago. Current antihyperlipidemic treatment includes diet change. There are no compliance problems.  Risk factors for coronary artery disease include dyslipidemia.    Four nights per month does ob, gts some sleep ,  Exercise going reaonable mediocre the past yr   Problems with excessive sleepiness, snoring some involved,  Twice per day will get slep y and go to sleep   ususally after lunch    History of factor V Leiden deficiency.  Patient has had multiple episodes of superficial thrombophlebitis.  Positive episode of it being very advanced.  Positive episode also DVT.        Review of Systems No headache, no major weight loss or weight gain, no chest pain no back pain abdominal pain no change in bowel habits complete ROS otherwise negative     Objective:   Physical Exam Alert and oriented, vitals reviewed and stable, NAD ENT-TM's and ext canals WNL bilat via otoscopic exam Soft palate, tonsils and post pharynx WNL via oropharyngeal exam Neck-symmetric, no masses; thyroid nonpalpable and nontender Pulmonary-no tachypnea or accessory muscle use; Clear without wheezes via auscultation Card--no abnrml murmurs, rhythm reg and rate WNL Carotid pulses symmetric, without bruits        Assessment & Plan:  Impression 1 discussed.  With hyperlipidemia.  HDL strong.  At this time will hold off on medication  2.  Daytime drowsiness.  Along with excess weight.  And history of snoring.  Patient wishes to hold off on sleep study for now.  If he changes his mind he will contact us  3.  Recurrent DVT/superficial thrombophlebitis with chronic coagulopathy Long discussion held.  Will maintain on Xarelto.  Rationale discussed  Greater than 50% of this 25 minute  face to face visit was spent in counseling and discussion and coordination of care regarding the above diagnosis/diagnosies

## 2017-03-06 ENCOUNTER — Ambulatory Visit: Payer: 59 | Admitting: Orthopedic Surgery

## 2017-03-13 ENCOUNTER — Other Ambulatory Visit: Payer: Self-pay | Admitting: Family Medicine

## 2017-03-13 NOTE — Telephone Encounter (Signed)
Last seen 01/11/2017 please advise

## 2017-03-14 ENCOUNTER — Telehealth: Payer: Self-pay | Admitting: Family Medicine

## 2017-03-14 NOTE — Telephone Encounter (Signed)
Pt is needing refills on XARELTO 20 MG TABS tablet   [facility]

## 2017-04-10 DIAGNOSIS — H524 Presbyopia: Secondary | ICD-10-CM | POA: Diagnosis not present

## 2017-04-10 DIAGNOSIS — H52223 Regular astigmatism, bilateral: Secondary | ICD-10-CM | POA: Diagnosis not present

## 2017-04-10 DIAGNOSIS — H5213 Myopia, bilateral: Secondary | ICD-10-CM | POA: Diagnosis not present

## 2017-04-27 ENCOUNTER — Telehealth: Payer: Self-pay | Admitting: *Deleted

## 2017-04-27 NOTE — Telephone Encounter (Signed)
Patient called to advise he will be leaving town 05/05/17 and needs anti malaria medications we usually give prior to his travels. Advised will have the travel nurse give him a call on Monday as soon as she is available. He advised he can come by clinic to pick up Rx if needed.

## 2017-04-30 ENCOUNTER — Telehealth: Payer: Self-pay | Admitting: *Deleted

## 2017-04-30 DIAGNOSIS — Z9189 Other specified personal risk factors, not elsewhere classified: Secondary | ICD-10-CM

## 2017-04-30 DIAGNOSIS — Z7184 Encounter for health counseling related to travel: Secondary | ICD-10-CM

## 2017-04-30 MED ORDER — DOXYCYCLINE HYCLATE 100 MG PO TABS: 100.0000 mg | ORAL_TABLET | Freq: Every day | ORAL | 0 refills | Status: DC

## 2017-04-30 MED ORDER — CIPROFLOXACIN HCL 500 MG PO TABS: 500.0000 mg | ORAL_TABLET | Freq: Two times a day (BID) | ORAL | 0 refills | Status: DC

## 2017-04-30 NOTE — Telephone Encounter (Signed)
Yep, ok with me. thanks
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
## 2017-04-30 NOTE — Telephone Encounter (Signed)
Refills sent, Patient notified. Thanks!

## 2017-04-30 NOTE — Addendum Note (Signed)
Addended by: Landis Gandy on: 04/30/2017 02:00 PM   Modules accepted: Orders

## 2017-05-03 ENCOUNTER — Telehealth: Payer: Self-pay

## 2017-05-03 NOTE — Telephone Encounter (Signed)
Pt called triage today asking to see if his meds for malaria and travelers diarrhea were sent to the pharmacy. Last note from RN stats that the order were sent and confirmation is visible in our system. Pt will call pharmacy to double check and will call us if he needs anything else.  [place]

## 2017-05-30 ENCOUNTER — Other Ambulatory Visit: Payer: Self-pay | Admitting: Family Medicine

## 2017-06-25 ENCOUNTER — Telehealth: Payer: Self-pay | Admitting: Family Medicine

## 2017-06-25 ENCOUNTER — Encounter: Payer: Self-pay | Admitting: Family Medicine

## 2017-06-25 ENCOUNTER — Ambulatory Visit (INDEPENDENT_AMBULATORY_CARE_PROVIDER_SITE_OTHER): Payer: 59 | Admitting: Family Medicine

## 2017-06-25 VITALS — BP 136/86 | Ht 77.0 in | Wt 285.0 lb

## 2017-06-25 DIAGNOSIS — R21 Rash and other nonspecific skin eruption: Secondary | ICD-10-CM | POA: Diagnosis not present

## 2017-06-25 DIAGNOSIS — I82402 Acute embolism and thrombosis of unspecified deep veins of left lower extremity: Secondary | ICD-10-CM | POA: Diagnosis not present

## 2017-06-25 DIAGNOSIS — Z0000 Encounter for general adult medical examination without abnormal findings: Secondary | ICD-10-CM

## 2017-06-25 DIAGNOSIS — D6851 Activated protein C resistance: Secondary | ICD-10-CM

## 2017-06-25 NOTE — Progress Notes (Signed)
Subjective:    Patient ID: Kenneth Kind., male    DOB: 1950-03-01, 67 y.o.   MRN: [id number]   Rash   This is a new problem. The current episode started more than 1 month ago (on and off for about 6 months, has became worse in the past 2 weeks). The affected locations include the left hand, right hand, left buttock, right buttock, right foot and left foot.  The rash is characterized by dryness and itchiness. He was exposed to nothing. Pertinent negatives include no congestion, cough, fever, rhinorrhea or vomiting. Treatments tried: soaking hands in hot water or taking hot shower.  The treatment provided moderate relief.    Itching and scratching s4ver hands  signifivcant   Has tried otc agents, fif not take self off xarelto  Six mo duratin  Uses topical lidocaine  Using claritin   The patient comes in today for a wellness visit.    A review of their health history was completed.  A review of medications was also completed.  Any needed refills; none  Eating habits: overall good  Falls/  MVA accidents in past few months: zero  Regular exercise: not ideal but decent  Specialist pt sees on regular basis: no  Preventative health issues were discussed.   Additional concerns: Aggravating rash  Colonoscopy  Five yrs ago due 10      Review of Systems  Constitutional: Negative for activity change, appetite change and fever.  HENT: Negative for congestion and rhinorrhea.   Eyes: Negative for discharge.  Respiratory: Negative for cough and wheezing.   Cardiovascular: Negative for chest pain.  Gastrointestinal: Negative for abdominal pain, blood in stool and vomiting.  Genitourinary: Negative for difficulty urinating and frequency.  Musculoskeletal: Negative for neck pain.  Skin: Positive for rash.  Allergic/Immunologic: Negative for environmental allergies and food allergies.  Neurological: Negative for weakness and headaches.  Psychiatric/Behavioral: Negative for  agitation.  All other systems reviewed and are negative.      Objective:   Physical Exam  Constitutional: He appears well-developed and well-nourished.  HENT:  Head: Normocephalic and atraumatic.  Right Ear: External ear normal.  Left Ear: External ear normal.  Nose: Nose normal.  Mouth/Throat: Oropharynx is clear and moist.  Eyes: Pupils are equal, round, and reactive to light. EOM are normal.  Neck: Normal range of motion. Neck supple. No thyromegaly present.  Cardiovascular: Normal rate, regular rhythm and normal heart sounds.  No murmur heard. Pulmonary/Chest: Effort normal and breath sounds normal. No respiratory distress. He has no wheezes.  Abdominal: Soft. Bowel sounds are normal. He exhibits no distension and no mass. There is no tenderness.  Genitourinary: Penis normal.  Musculoskeletal: Normal range of motion. He exhibits no edema.  Lymphadenopathy:    He has no cervical adenopathy.  Neurological: He is alert. He exhibits normal muscle tone.  Skin: Skin is warm and dry. No erythema.  Psychiatric: He has a normal mood and affect. His behavior is normal. Judgment normal.  Vitals reviewed.  Skin revealsMultiple discrete tiny erythematous macules on hands with some secondary superficial blistering rash but the description on the buttocks when it comes and goes is more urticarial with swelling       Assessment & Plan:  Impression wellness exam.  Up-to-date on colonoscopy.  Diet discussed exercise discussed.1 vaccines discussed up-to-date with these.  Overall mental health in good place  2.  Chronic Xarelto therapy for recurrent DVT problems.  Known factor V Leyden deficiency  3.  Rash somewhat curious with urticarial illness on buttocks and similar changes/exact etiology uncertain/.  To cover histamine cascade with switching Protonix to Zantac.  And starting daily cetirizine rationale discussed  Blood work reviewed

## 2017-06-25 NOTE — Telephone Encounter (Signed)
Review blood work results in yellow box.

## 2017-06-26 ENCOUNTER — Other Ambulatory Visit: Payer: Self-pay

## 2017-06-26 MED ORDER — CETIRIZINE HCL 10 MG PO TABS: ORAL_TABLET | ORAL | 3 refills | Status: DC

## 2017-06-26 MED ORDER — RANITIDINE HCL 300 MG PO TABS
ORAL_TABLET | ORAL | 3 refills | Status: DC
Start: 2017-06-26 — End: 2018-12-20

## 2017-07-09 ENCOUNTER — Telehealth: Payer: Self-pay | Admitting: *Deleted

## 2017-07-09 NOTE — Telephone Encounter (Signed)
Left message for pt to return call.   Dr Richardson Landry reviewed bloodwork results. Dr Richardson Landry wants pt to work on decreasing sugar in diet and try to increase exercise. Renal function borderline normal. Increase fluid intake a bit. Be sure to check yearly per dr Richardson Landry.

## 2017-07-10 NOTE — Telephone Encounter (Signed)
Pt returned call; informed patient of results. Pt verbalized understanding

## 2017-08-04 ENCOUNTER — Emergency Department (HOSPITAL_COMMUNITY)
Admission: EM | Admit: 2017-08-04 | Discharge: 2017-08-04 | Disposition: A | Discharge status: Home / Self Care | Payer: 59 | Attending: Emergency Medicine | Admitting: Emergency Medicine

## 2017-08-04 ENCOUNTER — Encounter (HOSPITAL_COMMUNITY): Payer: Self-pay | Admitting: Emergency Medicine

## 2017-08-04 ENCOUNTER — Other Ambulatory Visit: Payer: Self-pay

## 2017-08-04 ENCOUNTER — Emergency Department (HOSPITAL_COMMUNITY): Payer: 59

## 2017-08-04 DIAGNOSIS — Y9355 Activity, bike riding: Secondary | ICD-10-CM | POA: Diagnosis not present

## 2017-08-04 DIAGNOSIS — Z7901 Long term (current) use of anticoagulants: Secondary | ICD-10-CM | POA: Insufficient documentation

## 2017-08-04 DIAGNOSIS — T148XXA Other injury of unspecified body region, initial encounter: Secondary | ICD-10-CM

## 2017-08-04 DIAGNOSIS — S40212A Abrasion of left shoulder, initial encounter: Secondary | ICD-10-CM | POA: Diagnosis not present

## 2017-08-04 DIAGNOSIS — Z79899 Other long term (current) drug therapy: Secondary | ICD-10-CM | POA: Diagnosis not present

## 2017-08-04 DIAGNOSIS — Y929 Unspecified place or not applicable: Secondary | ICD-10-CM | POA: Diagnosis not present

## 2017-08-04 DIAGNOSIS — Y999 Unspecified external cause status: Secondary | ICD-10-CM | POA: Insufficient documentation

## 2017-08-04 DIAGNOSIS — S8002XA Contusion of left knee, initial encounter: Secondary | ICD-10-CM | POA: Insufficient documentation

## 2017-08-04 DIAGNOSIS — S8992XA Unspecified injury of left lower leg, initial encounter: Secondary | ICD-10-CM | POA: Diagnosis present

## 2017-08-04 DIAGNOSIS — S80212A Abrasion, left knee, initial encounter: Secondary | ICD-10-CM | POA: Diagnosis not present

## 2017-08-04 MED ORDER — BACITRACIN ZINC 500 UNIT/GM EX OINT
TOPICAL_OINTMENT | CUTANEOUS | Status: AC
  Filled 2017-08-04: qty 4.5
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
## 2017-08-04 NOTE — Discharge Instructions (Addendum)
Keep wounds clean, watch for signs of infection. Return for new or worsening symptoms. Tylenol and ice as needed for pain. Hold next dose of xarelto then restart as normal.

## 2017-08-04 NOTE — ED Notes (Signed)
Ice pack applied to patients left knee.
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
## 2017-11-12 ENCOUNTER — Encounter: Payer: Self-pay | Admitting: Orthopedic Surgery

## 2017-11-12 ENCOUNTER — Ambulatory Visit (INDEPENDENT_AMBULATORY_CARE_PROVIDER_SITE_OTHER): Payer: 59

## 2017-11-12 ENCOUNTER — Ambulatory Visit (INDEPENDENT_AMBULATORY_CARE_PROVIDER_SITE_OTHER): Payer: 59 | Admitting: Orthopedic Surgery

## 2017-11-12 VITALS — BP 146/79 | HR 75 | Ht 77.0 in | Wt 289.0 lb

## 2017-11-12 DIAGNOSIS — M7582 Other shoulder lesions, left shoulder: Secondary | ICD-10-CM | POA: Diagnosis not present

## 2017-11-12 DIAGNOSIS — M25562 Pain in left knee: Secondary | ICD-10-CM

## 2017-11-12 DIAGNOSIS — M25512 Pain in left shoulder: Secondary | ICD-10-CM | POA: Diagnosis not present

## 2017-11-12 DIAGNOSIS — G8929 Other chronic pain: Secondary | ICD-10-CM

## 2017-11-12 DIAGNOSIS — M7522 Bicipital tendinitis, left shoulder: Secondary | ICD-10-CM
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
## 2018-03-02 ENCOUNTER — Other Ambulatory Visit: Payer: Self-pay | Admitting: Family Medicine

## 2018-03-06 NOTE — Telephone Encounter (Signed)
Six mo worth 

## 2018-04-28 ENCOUNTER — Encounter: Payer: Self-pay | Admitting: Orthopedic Surgery

## 2018-11-08 ENCOUNTER — Telehealth: Payer: Self-pay | Admitting: Family Medicine

## 2018-11-08 DIAGNOSIS — Z125 Encounter for screening for malignant neoplasm of prostate: Secondary | ICD-10-CM

## 2018-11-08 DIAGNOSIS — Z0000 Encounter for general adult medical examination without abnormal findings: Secondary | ICD-10-CM

## 2018-11-08 MED ORDER — RIVAROXABAN 20 MG PO TABS: ORAL_TABLET | ORAL | 1 refills | Status: DC

## 2018-11-08 NOTE — Telephone Encounter (Signed)
Medication sent in and message left on pt voicemail

## 2018-11-08 NOTE — Telephone Encounter (Signed)
Please go ahead with 90-day on Xarelto as requested Please send it in right away to get the ball rolling on it Please notify patient this was completed

## 2018-11-08 NOTE — Telephone Encounter (Signed)
Last completed lab was Lipid on 04/03/2016. Please advise. Thank you

## 2018-11-08 NOTE — Telephone Encounter (Signed)
Last seen 06/25/17 for wellness. Please advise. Thank you

## 2018-11-08 NOTE — Telephone Encounter (Signed)
Patient made an appt for a physical in a couple of weeks but just realized he only has a few Xarelto left and was hoping Dr. Nicki Reaper can do the refill today since it takes a little longer to get them from [place].  Ok to leave a msg on cell #.

## 2018-11-08 NOTE — Telephone Encounter (Signed)
Patient is scheduled for a physical 11/21/2018 and would like to have labwork done prior to appt at [place].  Please leave a msg on his cell phone when orders are in.

## 2018-11-11 NOTE — Telephone Encounter (Signed)
Orders put in and pt notified.  

## 2018-11-11 NOTE — Telephone Encounter (Signed)
Met 7 lip liv cbc psa 

## 2018-11-14 DIAGNOSIS — Z125 Encounter for screening for malignant neoplasm of prostate: Secondary | ICD-10-CM | POA: Diagnosis not present

## 2018-11-14 DIAGNOSIS — Z0000 Encounter for general adult medical examination without abnormal findings: Secondary | ICD-10-CM | POA: Diagnosis not present

## 2018-11-15 LAB — BASIC METABOLIC PANEL
BUN/Creatinine Ratio: 15 (ref 10–24)
BUN: 17 mg/dL (ref 8–27)
CO2: 21 mmol/L (ref 20–29)
Calcium: 9.3 mg/dL (ref 8.6–10.2)
Chloride: 103 mmol/L (ref 96–106)
Creatinine, Ser: 1.16 mg/dL (ref 0.76–1.27)
GFR calc Af Amer: 75 mL/min/{1.73_m2} (ref >59)
GFR calc non Af Amer: 65 mL/min/{1.73_m2} (ref >59)
Glucose: 115 mg/dL — ABNORMAL HIGH (ref 65–99)
Potassium: 4.5 mmol/L (ref 3.5–5.2)
Sodium: 138 mmol/L (ref 134–144)

## 2018-11-15 LAB — CBC WITH DIFFERENTIAL/PLATELET
Basophils Absolute: 0.1 10*3/uL (ref 0.0–0.2)
Basos: 1 %
EOS (ABSOLUTE): 0.1 10*3/uL (ref 0.0–0.4)
Eos: 1 %
Hematocrit: 47.9 % (ref 37.5–51.0)
Hemoglobin: 15.4 g/dL (ref 13.0–17.7)
Immature Grans (Abs): 0 10*3/uL (ref 0.0–0.1)
Immature Granulocytes: 0 %
Lymphocytes Absolute: 1.5 10*3/uL (ref 0.7–3.1)
Lymphs: 26 %
MCH: 26.8 pg (ref 26.6–33.0)
MCHC: 32.2 g/dL (ref 31.5–35.7)
MCV: 83 fL (ref 79–97)
Monocytes Absolute: 0.4 10*3/uL (ref 0.1–0.9)
Monocytes: 7 %
Neutrophils Absolute: 3.8 10*3/uL (ref 1.4–7.0)
Neutrophils: 65 %
Platelets: 207 10*3/uL (ref 150–450)
RBC: 5.74 x10E6/uL (ref 4.14–5.80)
RDW: 14.3 % (ref 11.6–15.4)
WBC: 5.9 10*3/uL (ref 3.4–10.8)

## 2018-11-15 LAB — LIPID PANEL
Chol/HDL Ratio: 4.2 ratio (ref 0.0–5.0)
Cholesterol, Total: 175 mg/dL (ref 100–199)
HDL: 42 mg/dL
LDL Chol Calc (NIH): 109 mg/dL — ABNORMAL HIGH (ref 0–99)
Triglycerides: 133 mg/dL (ref 0–149)
VLDL Cholesterol Cal: 24 mg/dL (ref 5–40)

## 2018-11-15 LAB — HEPATIC FUNCTION PANEL
ALT: 28 IU/L (ref 0–44)
AST: 20 IU/L (ref 0–40)
Albumin: 4.4 g/dL (ref 3.8–4.8)
Alkaline Phosphatase: 64 IU/L (ref 39–117)
Bilirubin Total: 0.8 mg/dL (ref 0.0–1.2)
Bilirubin, Direct: 0.18 mg/dL (ref 0.00–0.40)
Total Protein: 6.5 g/dL (ref 6.0–8.5)

## 2018-11-15 LAB — PSA: Prostate Specific Ag, Serum: 1.9 ng/mL (ref 0.0–4.0)

## 2018-11-21 ENCOUNTER — Other Ambulatory Visit: Payer: Self-pay

## 2018-11-21 ENCOUNTER — Ambulatory Visit (INDEPENDENT_AMBULATORY_CARE_PROVIDER_SITE_OTHER): Payer: 59 | Admitting: Family Medicine

## 2018-11-21 ENCOUNTER — Encounter: Payer: Self-pay | Admitting: Family Medicine

## 2018-11-21 VITALS — BP 132/90 | Temp 96.9°F | Ht 77.0 in | Wt 276.8 lb

## 2018-11-21 DIAGNOSIS — D6851 Activated protein C resistance: Secondary | ICD-10-CM | POA: Diagnosis not present

## 2018-11-21 DIAGNOSIS — R7301 Impaired fasting glucose: Secondary | ICD-10-CM

## 2018-11-21 DIAGNOSIS — R21 Rash and other nonspecific skin eruption: Secondary | ICD-10-CM

## 2018-11-21 DIAGNOSIS — Z0000 Encounter for general adult medical examination without abnormal findings: Secondary | ICD-10-CM

## 2018-11-21 LAB — POCT GLYCOSYLATED HEMOGLOBIN (HGB A1C): Hemoglobin A1C: 5.2 % (ref 4.0–5.6)

## 2018-11-21 MED ORDER — KETOCONAZOLE 2 % EX CREA: TOPICAL_CREAM | CUTANEOUS | 2 refills | Status: DC

## 2018-11-21 MED ORDER — RIVAROXABAN 20 MG PO TABS: ORAL_TABLET | ORAL | 3 refills | Status: DC
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
## 2018-12-20 ENCOUNTER — Ambulatory Visit (INDEPENDENT_AMBULATORY_CARE_PROVIDER_SITE_OTHER): Payer: 59 | Admitting: Family Medicine

## 2018-12-20 ENCOUNTER — Other Ambulatory Visit: Payer: Self-pay

## 2018-12-20 DIAGNOSIS — F320 Major depressive disorder, single episode, mild: Secondary | ICD-10-CM | POA: Diagnosis not present

## 2018-12-20 DIAGNOSIS — F411 Generalized anxiety disorder: Secondary | ICD-10-CM

## 2018-12-20 DIAGNOSIS — R41840 Attention and concentration deficit: Secondary | ICD-10-CM | POA: Diagnosis not present

## 2018-12-20 MED ORDER — ESCITALOPRAM OXALATE 20 MG PO TABS: 20.0000 mg | ORAL_TABLET | Freq: Every day | ORAL | 1 refills | Status: DC

## 2018-12-20 NOTE — Progress Notes (Signed)
   Subjective:    Patient ID:   HPI      Review of Systems     Objective:   Physical Exam        Assessment & Plan:

## 2018-12-20 NOTE — Progress Notes (Signed)
   Subjective:    Patient ID: Jonnie Kind., male    DOB: 16-Jun-1950, 68 y.o.   MRN: [id number]  HPIfollow up on mental health.  Patient presents for protracted discussion.  Please see PHQ-9 and GAD-7 forms filled out prior.  Patient having significant elements of anxiety.  Also mild depression.  No suicidal or homicidal thoughts.  Experiencing dysphoria at times.  This is had impact on both his professional and personal life  An additional challenge is difficulty with focusing.  This has been an issue in the past for the patient seems to be worse during this period of increased difficulties with mood.  Patient exercising but not as much as he had hoped.  He has been working on his weight with some success.  Also has moved to part-time employment which is improved his stress load.  Still dealing however with substantial issues of mild depression anxiety and focusing      Review of Systems No headache no chest pain no shortness of breath    Objective:   Physical Exam  Virtual      Assessment & Plan:  Impression 1 depression.  Mild to moderate.  Patient is in the process of pursuing counseling which I think is an extremely good idea.  In fact is element of depression alone may not be enough to warrant medication if it were not for other comorbidities.  Patient also expresses elements of anxiety.  He also has ongoing issues of focusing and attention.  His ADHD is likely a separate issue but certainly exacerbated by current mood disorder.  Recommend proceeding with counseling.  While at the same time pharmacotherapy.  Side effects benefits discussed.  Questions answered  Greater than 50% of this 25 minute non-face to face visit was spent in counseling and discussion and coordination of care regarding the above diagnosis/diagnosies   Follow-up in several months

## 2018-12-28 ENCOUNTER — Encounter: Payer: Self-pay | Admitting: Family Medicine

## 2019-02-06 ENCOUNTER — Encounter: Payer: Self-pay | Admitting: Family Medicine

## 2019-03-10 IMAGING — MR MR ANKLE*R* W/O CM
4 of 5 series · 30 of 40 positions shown · non-contrast
Comparison: None.

CLINICAL DATA: Pain and swelling along the lateral and posterior
aspect of the right ankle status post fall from 5 feet 3 days ago.

EXAM:
MRI OF THE RIGHT ANKLE WITHOUT CONTRAST
TECHNIQUE: Multiplanar, multisequence MR imaging of the ankle was performed. No
intravenous contrast was administered.

[Series 3: t2fs axial · axial · 3.0mm · 0.47mm/px · z∈[-107,+34]mm · 9 of 40 slices shown]
[im 1/40]
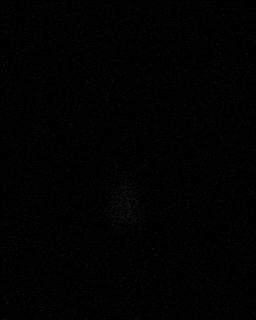
[im 5/40]
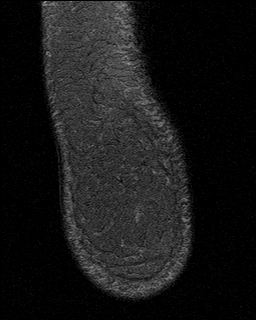
[im 10/40]
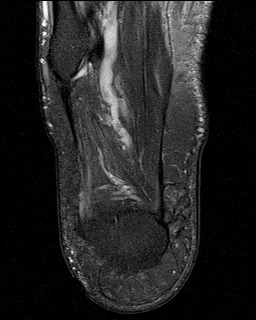
[im 15/40]
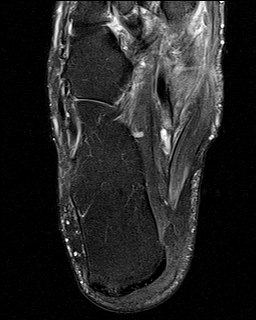
[im 20/40]
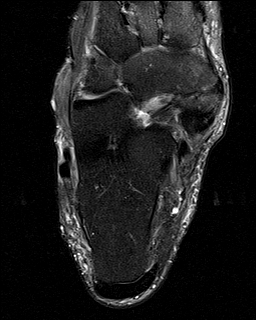
[im 25/40]
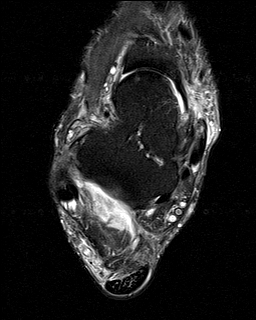
[im 30/40]
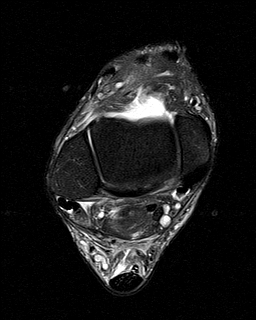
[im 35/40]
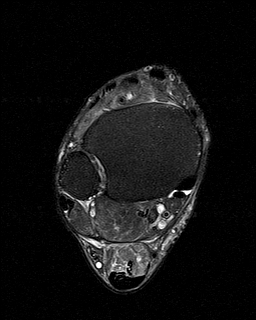
[im 40/40]
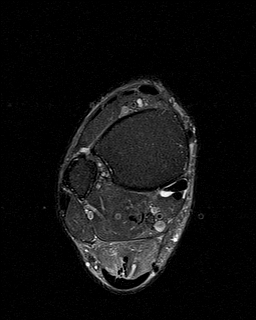

[Series 4: T1 · axial · 3.0mm · 0.47mm/px · z∈[-107,+34]mm · 9 of 40 slices shown (1 of 2)]
[im 1/40]
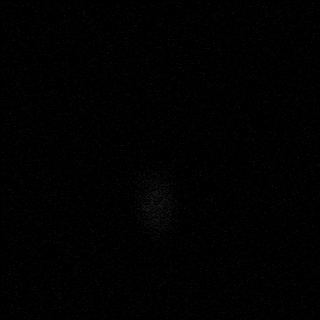
[im 5/40]
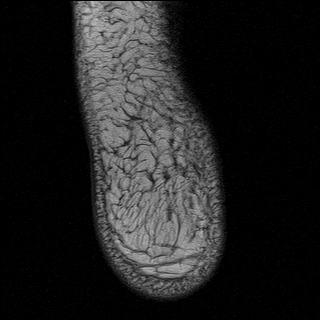
[im 10/40]
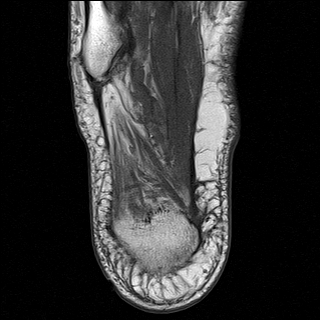
[im 15/40]
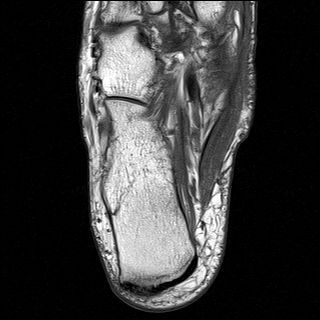
[im 20/40]
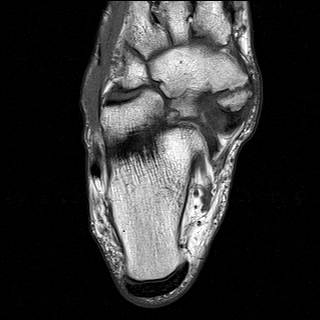
[im 25/40]
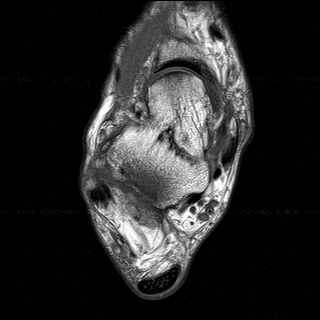
[im 30/40]
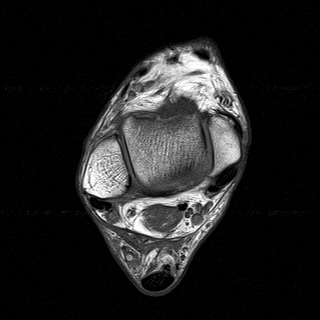
[im 35/40]
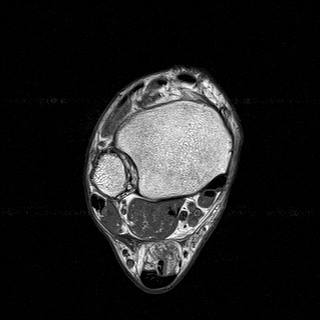
[im 40/40]
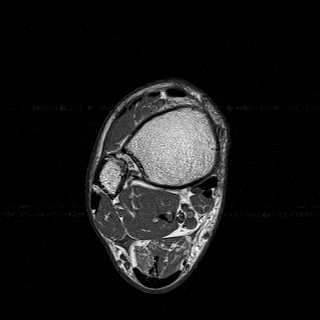

[Series 5: T1 · sagittal · 3.0mm · 0.47mm/px · 7 of 28 slices shown (2 of 2)]
[im 1/28]
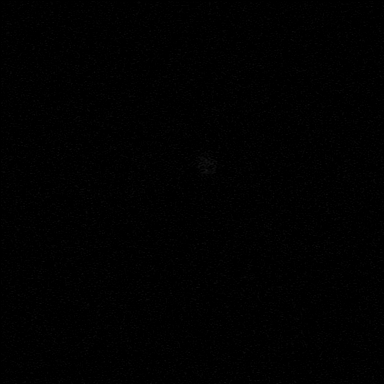
[im 5/28]
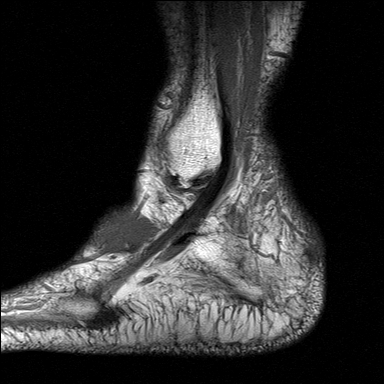
[im 10/28]
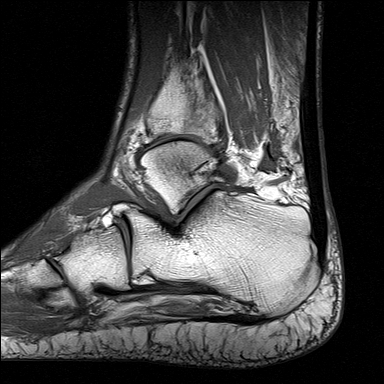
[im 14/28]
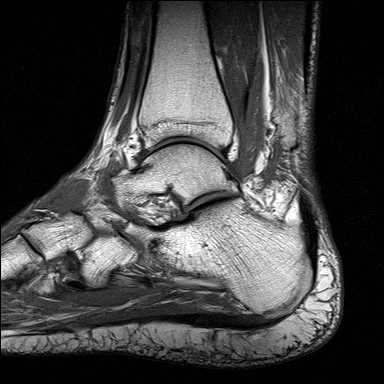
[im 19/28]
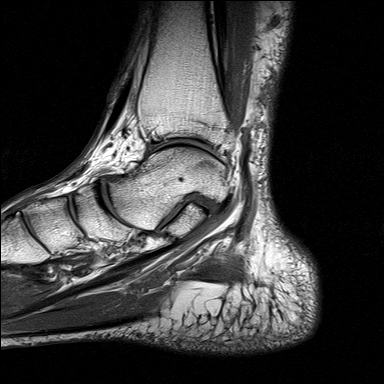
[im 23/28]
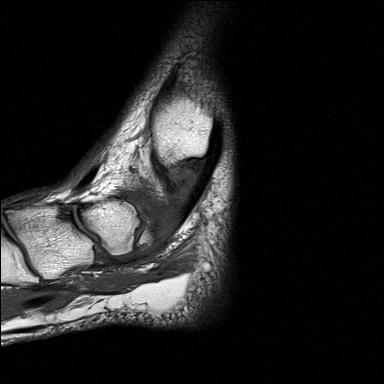
[im 28/28]
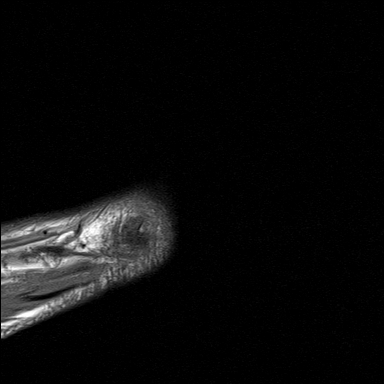

[Series 6: ir sag · sagittal · 3.0mm · 0.27mm/px · 5 of 28 slices shown]
[im 1/28]
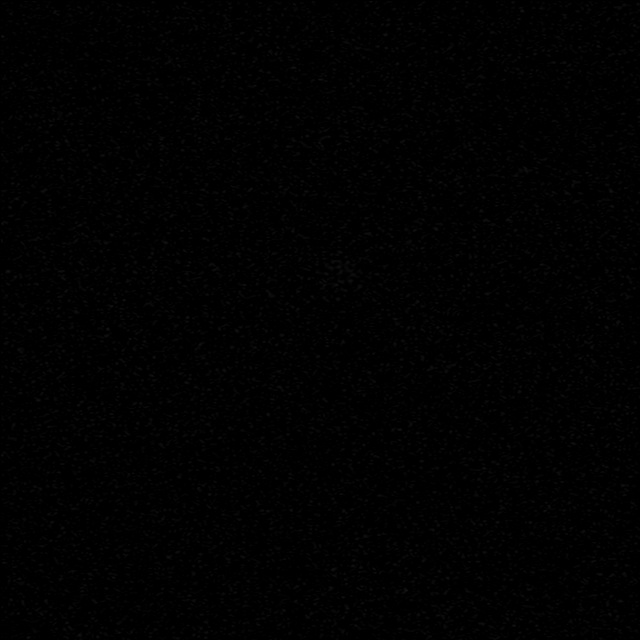
[im 5/28]
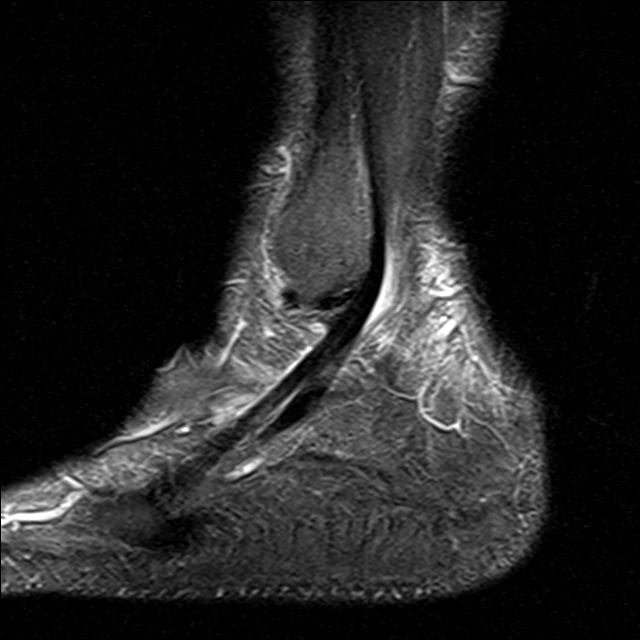
[im 10/28]
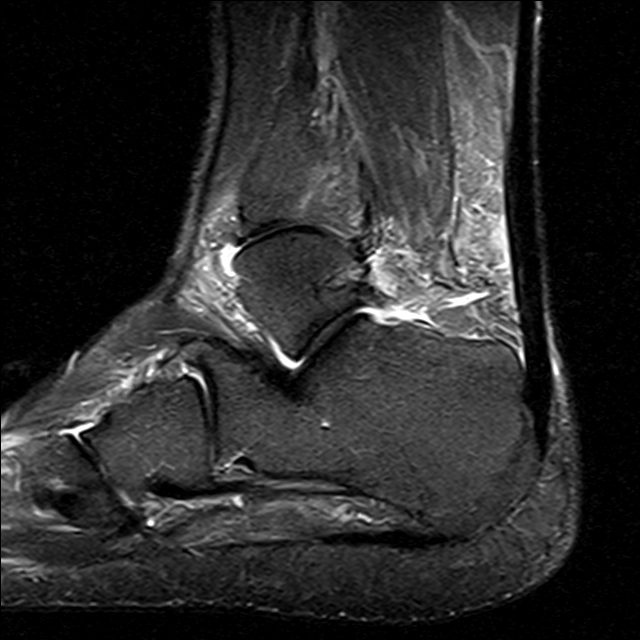
[im 14/28]
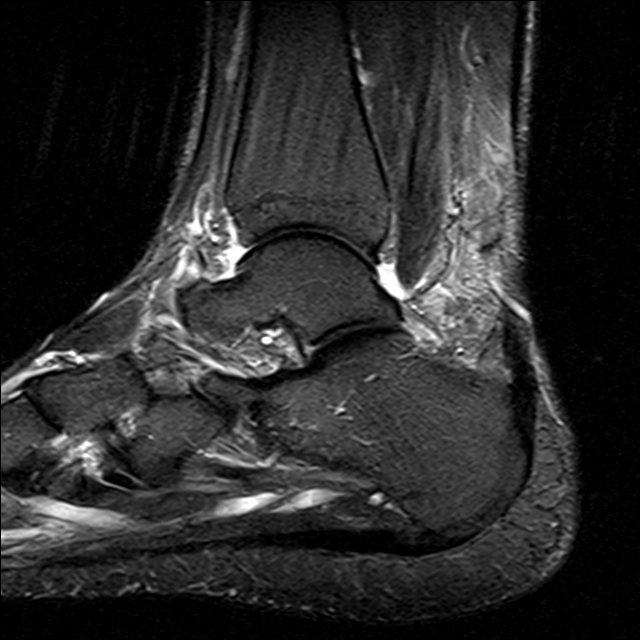
[im 23/28]
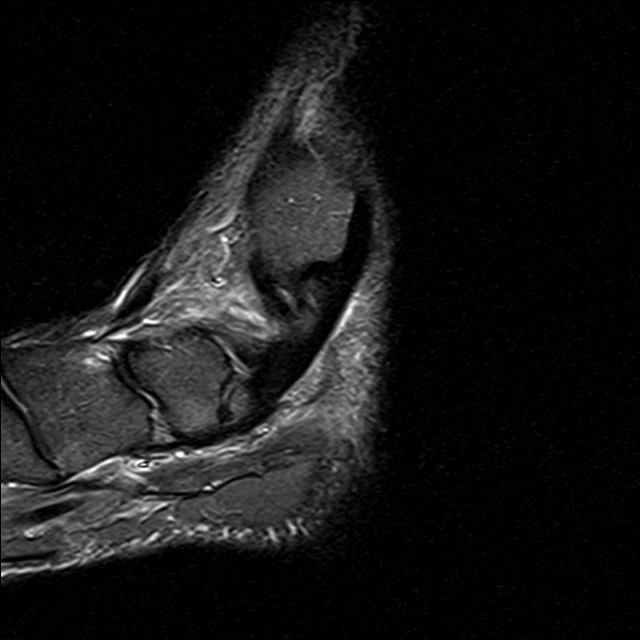

[30 of 40 positions shown; findings below may reference images not displayed]

FINDINGS: TENDONS

Peroneal: Intact peroneus longus and peroneus brevis tendons. Mild
intramuscular edema of the peroneal brevis.

Posteromedial: Intact tibialis posterior, flexor hallucis longus and
flexor digitorum longus tendons. Mild intramuscular edema of the
soleus and flexor hallucis longus muscles

Anterior: Intact tibialis anterior, extensor hallucis longus and
extensor digitorum longus tendons.

Achilles: Intact.

Plantar Fascia: Intact.

LIGAMENTS

Lateral: Intact anterior and posterior talofibular and tibiofibular
ligaments

Medial: Intact deep and superficial deltoid ligament. Intact
appearing spring ligament.

CARTILAGE

Ankle Joint: No chondral defect.

Subtalar Joints/Sinus Tarsi: No joint effusion or chondral defect.

Bones: No marrow signal abnormality. No fracture nor dislocations.
Calcaneal enthesopathy along the plantar aspect. Type 2 accessory
navicular.

Other: Small ankle joint effusion. Induration of Kager's fat pad.
Trace retrocalcaneal bursal fluid.
IMPRESSION: 1. Periarticular soft tissue induration at the level of the ankle
joint with induration of Kager's fat pad. Small ankle joint
effusion. Mild intramuscular edema of the flexor hallucis longus,
soleus and peroneal brevis muscles suggestive muscle strains or
contusions.
2. No marrow signal abnormality indicative of a fracture. No joint
dislocation.
3. Type 2 accessory navicular.

## 2019-07-04 ENCOUNTER — Telehealth: Payer: Self-pay | Admitting: Family Medicine

## 2019-07-04 DIAGNOSIS — R7611 Nonspecific reaction to tuberculin skin test without active tuberculosis: Secondary | ICD-10-CM

## 2019-07-04 NOTE — Telephone Encounter (Signed)
Lmtc

## 2019-07-04 NOTE — Telephone Encounter (Signed)
Patient not having any symptoms. He is going through the process of getting things done for some work he is trying to get involved in and the gold tb test came back positive.

## 2019-07-04 NOTE — Telephone Encounter (Signed)
Dr Glo Herring called and said that he tested positive on his TB test and he wants [place] to order a PA and Lateral Chest x-ray

## 2019-07-04 NOTE — Telephone Encounter (Signed)
Please submit urgent referral to infectious disease.  He might have latent TB.  But needs to be seen or discussion with  ID physician.  Thx.   Dr. Lovena Le

## 2019-07-05 ENCOUNTER — Ambulatory Visit (HOSPITAL_COMMUNITY)
Admission: RE | Admit: 2019-07-05 | Discharge: 2019-07-05 | Disposition: A | Discharge status: Home / Self Care | Payer: 59 | Source: Ambulatory Visit | Attending: Family Medicine | Admitting: Family Medicine

## 2019-07-05 DIAGNOSIS — R7611 Nonspecific reaction to tuberculin skin test without active tuberculosis: Secondary | ICD-10-CM | POA: Insufficient documentation

## 2019-07-08 NOTE — Telephone Encounter (Signed)
Results discussed with patient. Patient advised per Dr Lovena Le: Negative chest xray- no signs of active lesion in the chest.    follow up for infectious disease referral.  Patient verbalized understanding and wants to proceed with referral to infectious disease. Referral ordered in [facility]

## 2019-07-09 ENCOUNTER — Encounter: Payer: Self-pay | Admitting: Family Medicine

## 2019-07-18 ENCOUNTER — Telehealth (INDEPENDENT_AMBULATORY_CARE_PROVIDER_SITE_OTHER): Payer: 59 | Admitting: Family Medicine

## 2019-07-18 ENCOUNTER — Other Ambulatory Visit: Payer: Self-pay

## 2019-07-18 DIAGNOSIS — F329 Major depressive disorder, single episode, unspecified: Secondary | ICD-10-CM | POA: Diagnosis not present

## 2019-07-18 DIAGNOSIS — I82402 Acute embolism and thrombosis of unspecified deep veins of left lower extremity: Secondary | ICD-10-CM

## 2019-07-18 DIAGNOSIS — N529 Male erectile dysfunction, unspecified: Secondary | ICD-10-CM | POA: Diagnosis not present

## 2019-07-18 DIAGNOSIS — F32A Depression, unspecified: Secondary | ICD-10-CM

## 2019-07-18 MED ORDER — RIVAROXABAN 20 MG PO TABS
ORAL_TABLET | ORAL | 3 refills | Status: DC
Start: 2019-07-18 — End: 2020-01-29

## 2019-07-18 MED ORDER — KETOCONAZOLE 2 % EX CREA: TOPICAL_CREAM | CUTANEOUS | 2 refills | Status: DC

## 2019-07-18 MED ORDER — SILDENAFIL CITRATE 20 MG PO TABS: ORAL_TABLET | ORAL | 5 refills | Status: DC

## 2019-07-18 MED ORDER — ESCITALOPRAM OXALATE 20 MG PO TABS: 20.0000 mg | ORAL_TABLET | Freq: Every day | ORAL | 1 refills | Status: DC

## 2019-07-18 NOTE — Progress Notes (Signed)
Patient ID: Kenneth Kind., male    DOB: 04/01/1950, 69 y.o.   MRN: [id number]   Chief Complaint  Patient presents with  . Follow-up    anxiety   Subjective:    Virtual Visit via Video Note  I connected with Kenneth Shirk V. on 07/27/19 at 11:30 AM EDT by a video enabled telemedicine application and verified that I am speaking with the correct person using two identifiers.  Location: Patient: home Provider: office   I discussed the limitations of evaluation and management by telemedicine and the availability of in person appointments. The patient expressed understanding and agreed to proceed.  HPI-   Pt had quantiferon gold, that was positive. Went to employee health.  2nd one was negative, so the first one was false positive. Pt called the ID consult and told it was negative.  Pt on life-long xarelto for left DVT.  Pt was having some stress with work- pt started on [employer]. Feeling depressed at times but improved since being on lexapro. About to retire soon. Start covering ob/gyn call at [employer] has a past medical history of ADHD (attention deficit hyperactivity disorder), Fatty liver, Hyperlipidemia, and Thrombophlebitis leg.   Outpatient Encounter Medications as of 07/18/2019  Medication Sig  . escitalopram (LEXAPRO) 20 MG tablet Take 1 tablet (20 mg total) by mouth daily.  Marland Kitchen ketoconazole (NIZORAL) 2 % cream Apply BID to affected area  . rivaroxaban (XARELTO) 20 MG TABS tablet TAKE 1 TABLET BY MOUTH DAILY WITH SUPPER  . sildenafil (REVATIO) 20 MG tablet 2 or 3 tablets po as needed  . [DISCONTINUED] escitalopram (LEXAPRO) 20 MG tablet Take 1 tablet (20 mg total) by mouth daily.  . [DISCONTINUED] ketoconazole (NIZORAL) 2 % cream Apply BID to affected area  . [DISCONTINUED] rivaroxaban (XARELTO) 20 MG TABS tablet TAKE 1 TABLET BY MOUTH DAILY WITH SUPPER  . [DISCONTINUED] sildenafil (REVATIO) 20 MG tablet 2 or 3 tablets po as needed  (Patient not taking: Reported on 11/21/2018)   No facility-administered encounter medications on file as of 07/18/2019.     Review of Systems  Constitutional: Negative for chills and fever.  HENT: Negative for congestion, rhinorrhea and sore throat.   Respiratory: Negative for cough, shortness of breath and wheezing.   Cardiovascular: Negative for chest pain and leg swelling.  Gastrointestinal: Negative for abdominal pain, diarrhea, nausea and vomiting.  Genitourinary: Negative for dysuria and frequency.  Skin: Negative for rash.  Neurological: Negative for dizziness, weakness and headaches.     Vitals There were no vitals taken for this visit.  Objective:   Physical Exam  Phone visit, no PE.  Assessment and Plan   1. Leg DVT (deep venous thromboembolism), acute, left (HCC) - rivaroxaban (XARELTO) 20 MG TABS tablet; TAKE 1 TABLET BY MOUTH DAILY WITH SUPPER  Dispense: 90 tablet; Refill: 3  2. Depression, unspecified depression type - escitalopram (LEXAPRO) 20 MG tablet; Take 1 tablet (20 mg total) by mouth daily.  Dispense: 90 tablet; Refill: 1  3. Erectile dysfunction, unspecified erectile dysfunction type - sildenafil (REVATIO) 20 MG tablet; 2 or 3 tablets po as needed  Dispense: 24 tablet; Refill: 5   H/o dvt- Needs refill on xarelto.  Anxiety/depression-stable, improved. lexapro is needed refill.  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    Father heart attack at 68 yrs old. 69yrs old had arrhythmia. 6'5" 270 lbs. ldl- slightly elevated 109.  F/u 79mo or prn.  Follow Up Instructions:  I discussed the assessment and treatment plan with the patient. The patient was provided an opportunity to ask questions and all were answered. The patient agreed with the plan and demonstrated an understanding of the instructions.   The patient was advised to call back or seek an in-person evaluation if the symptoms worsen or if the condition fails to improve as anticipated.  I  provided 11 minutes of non-face-to-face time during this encounter.

## 2019-09-11 ENCOUNTER — Telehealth: Payer: Self-pay | Admitting: Family Medicine

## 2019-09-11 ENCOUNTER — Other Ambulatory Visit (INDEPENDENT_AMBULATORY_CARE_PROVIDER_SITE_OTHER): Payer: 59

## 2019-09-11 DIAGNOSIS — Z23 Encounter for immunization: Secondary | ICD-10-CM

## 2019-09-11 MED ORDER — SHINGRIX 50 MCG/0.5ML IM SUSR: 0.5000 mL | Freq: Once | INTRAMUSCULAR | 1 refills | Status: AC

## 2019-09-11 NOTE — Telephone Encounter (Signed)
Pt contacted. Shingrix script printed and signed. Pt set up nurse visit for today at 4 for pneumococcal vaccine.

## 2019-09-11 NOTE — Addendum Note (Signed)
Addended by: Vicente Males on: 09/11/2019 10:49 AM   Modules accepted: Orders

## 2019-09-11 NOTE — Telephone Encounter (Signed)
Please contact patient to have him set up nurse visit for Pneumococcal 23. Shingrix script printed and will be placed up front when signed. Thank you!

## 2019-09-11 NOTE — Telephone Encounter (Signed)
Pt left message on clinical line. Pt would like Pneumovax and Shingles Vaccine before he retires at end of month. Please advise. Thank you

## 2019-09-11 NOTE — Telephone Encounter (Signed)
Patient may have a nurse visit for pneumococcal 23 needs to be done in September (please work with him and do a time that is convenient to his schedule) Go ahead and give prescription for Shingrix he can get locally

## 2019-09-12 ENCOUNTER — Other Ambulatory Visit (HOSPITAL_COMMUNITY): Payer: Self-pay | Admitting: Internal Medicine

## 2020-01-07 ENCOUNTER — Encounter: Payer: Self-pay | Admitting: *Deleted

## 2020-01-07 ENCOUNTER — Other Ambulatory Visit: Payer: Self-pay | Admitting: *Deleted

## 2020-01-07 DIAGNOSIS — Z125 Encounter for screening for malignant neoplasm of prostate: Secondary | ICD-10-CM

## 2020-01-07 DIAGNOSIS — R739 Hyperglycemia, unspecified: Secondary | ICD-10-CM

## 2020-01-07 DIAGNOSIS — Z79899 Other long term (current) drug therapy: Secondary | ICD-10-CM

## 2020-01-07 DIAGNOSIS — Z13220 Encounter for screening for lipoid disorders: Secondary | ICD-10-CM

## 2020-01-13 ENCOUNTER — Encounter: Payer: 59 | Admitting: Family Medicine

## 2020-01-28 DIAGNOSIS — R739 Hyperglycemia, unspecified: Secondary | ICD-10-CM | POA: Diagnosis not present

## 2020-01-28 DIAGNOSIS — Z13220 Encounter for screening for lipoid disorders: Secondary | ICD-10-CM | POA: Diagnosis not present

## 2020-01-28 DIAGNOSIS — Z79899 Other long term (current) drug therapy: Secondary | ICD-10-CM | POA: Diagnosis not present

## 2020-01-28 DIAGNOSIS — Z125 Encounter for screening for malignant neoplasm of prostate: Secondary | ICD-10-CM | POA: Diagnosis not present

## 2020-01-29 ENCOUNTER — Ambulatory Visit (INDEPENDENT_AMBULATORY_CARE_PROVIDER_SITE_OTHER): Payer: Medicare Other | Admitting: Family Medicine

## 2020-01-29 ENCOUNTER — Other Ambulatory Visit: Payer: Self-pay | Admitting: Family Medicine

## 2020-01-29 ENCOUNTER — Other Ambulatory Visit: Payer: Self-pay

## 2020-01-29 ENCOUNTER — Encounter: Payer: Self-pay | Admitting: Family Medicine

## 2020-01-29 VITALS — BP 120/78 | HR 57 | Temp 97.7°F | Wt 283.4 lb

## 2020-01-29 DIAGNOSIS — R7989 Other specified abnormal findings of blood chemistry: Secondary | ICD-10-CM

## 2020-01-29 DIAGNOSIS — I82402 Acute embolism and thrombosis of unspecified deep veins of left lower extremity: Secondary | ICD-10-CM

## 2020-01-29 DIAGNOSIS — R7301 Impaired fasting glucose: Secondary | ICD-10-CM

## 2020-01-29 DIAGNOSIS — L989 Disorder of the skin and subcutaneous tissue, unspecified: Secondary | ICD-10-CM | POA: Diagnosis not present

## 2020-01-29 DIAGNOSIS — Z86718 Personal history of other venous thrombosis and embolism: Secondary | ICD-10-CM

## 2020-01-29 DIAGNOSIS — F32A Depression, unspecified: Secondary | ICD-10-CM

## 2020-01-29 DIAGNOSIS — Z0000 Encounter for general adult medical examination without abnormal findings: Secondary | ICD-10-CM

## 2020-01-29 LAB — CBC WITH DIFFERENTIAL/PLATELET
Basophils Absolute: 0.1 10*3/uL (ref 0.0–0.2)
Basos: 1 %
EOS (ABSOLUTE): 0.1 10*3/uL (ref 0.0–0.4)
Eos: 2 %
Hematocrit: 48.3 % (ref 37.5–51.0)
Hemoglobin: 15.5 g/dL (ref 13.0–17.7)
Immature Grans (Abs): 0 10*3/uL (ref 0.0–0.1)
Immature Granulocytes: 0 %
Lymphocytes Absolute: 1.5 10*3/uL (ref 0.7–3.1)
Lymphs: 27 %
MCH: 26.3 pg — ABNORMAL LOW (ref 26.6–33.0)
MCHC: 32.1 g/dL (ref 31.5–35.7)
MCV: 82 fL (ref 79–97)
Monocytes Absolute: 0.4 10*3/uL (ref 0.1–0.9)
Monocytes: 7 %
Neutrophils Absolute: 3.4 10*3/uL (ref 1.4–7.0)
Neutrophils: 63 %
Platelets: 214 10*3/uL (ref 150–450)
RBC: 5.89 x10E6/uL — ABNORMAL HIGH (ref 4.14–5.80)
RDW: 13.5 % (ref 11.6–15.4)
WBC: 5.4 10*3/uL (ref 3.4–10.8)

## 2020-01-29 LAB — COMPREHENSIVE METABOLIC PANEL
ALT: 32 IU/L (ref 0–44)
AST: 34 IU/L (ref 0–40)
Albumin/Globulin Ratio: 2.1 (ref 1.2–2.2)
Albumin: 4.7 g/dL (ref 3.8–4.8)
Alkaline Phosphatase: 65 IU/L (ref 44–121)
BUN/Creatinine Ratio: 15 (ref 10–24)
BUN: 19 mg/dL (ref 8–27)
Bilirubin Total: 0.5 mg/dL (ref 0.0–1.2)
CO2: 23 mmol/L (ref 20–29)
Calcium: 9.6 mg/dL (ref 8.6–10.2)
Chloride: 102 mmol/L (ref 96–106)
Creatinine, Ser: 1.3 mg/dL — ABNORMAL HIGH (ref 0.76–1.27)
GFR calc Af Amer: 65 mL/min/{1.73_m2} (ref >59)
GFR calc non Af Amer: 56 mL/min/{1.73_m2} — ABNORMAL LOW (ref >59)
Globulin, Total: 2.2 g/dL (ref 1.5–4.5)
Glucose: 110 mg/dL — ABNORMAL HIGH (ref 65–99)
Potassium: 4.7 mmol/L (ref 3.5–5.2)
Sodium: 137 mmol/L (ref 134–144)
Total Protein: 6.9 g/dL (ref 6.0–8.5)

## 2020-01-29 LAB — LIPID PANEL
Chol/HDL Ratio: 4.5 ratio (ref 0.0–5.0)
Cholesterol, Total: 184 mg/dL (ref 100–199)
HDL: 41 mg/dL (ref >39)
LDL Chol Calc (NIH): 120 mg/dL — ABNORMAL HIGH (ref 0–99)
Triglycerides: 125 mg/dL (ref 0–149)
VLDL Cholesterol Cal: 23 mg/dL (ref 5–40)

## 2020-01-29 LAB — HEMOGLOBIN A1C
Est. average glucose Bld gHb Est-mCnc: 114 mg/dL
Hgb A1c MFr Bld: 5.6 % (ref 4.8–5.6)

## 2020-01-29 LAB — PSA: Prostate Specific Ag, Serum: 1.5 ng/mL (ref 0.0–4.0)

## 2020-01-29 MED ORDER — RIVAROXABAN 20 MG PO TABS: ORAL_TABLET | ORAL | 3 refills | Status: DC

## 2020-01-29 MED ORDER — ESCITALOPRAM OXALATE 20 MG PO TABS: 20.0000 mg | ORAL_TABLET | Freq: Every day | ORAL | 1 refills | Status: DC

## 2020-01-29 NOTE — Progress Notes (Signed)
Patient ID: Kenneth Kind., male    DOB: August 01, 1950, 70 y.o.   MRN: [id number]   Chief Complaint  Patient presents with  . Annual Exam    Patient would like to discuss recent lab work.  Patient experiencing infrequent rash since starting xarelto.  Patient reports a spot on the left side of his face- may needing derm referral.   Subjective:    HPI AWV- Annual Wellness Visit  The patient was seen for their annual wellness visit. The patient's past medical history, surgical history, and family history were reviewed. Pertinent vaccines were reviewed ( tetanus, pneumonia, shingles, flu) The patient's medication list was reviewed and updated.  The height and weight were entered.  BMI recorded in electronic record elsewhere  Cognitive screening was completed. Outcome of Mini - Cog:   Falls /depression screening electronically recorded within record elsewhere   Recent listing of emergency department/hospitalizations over the past year were reviewed.  current specialist the patient sees on a regular basis: none   Medicare annual wellness visit patient questionnaire was reviewed.  A written screening schedule for the patient for the next 5-10 years was given. Appropriate discussion of followup regarding next visit was discussed.   slight elevation in -Cr 1.3.  Slight elevated glucose 110. a1c good. Has factor 5 liden. Had h/o left leg dvt.   Pt was working and was depressed. Pt taking lexapro and doing well.  Had some concerns of ADD and his counselor recommended he might benefit from stimulant medications.   Counselor thought was helpful with concerta in past.  F/u visit together about concerta in next 1-2 months.  Left leg swelling and slight inc due to h/o dvt, but resolves by next morning. Rash on back of left leg and it resolves. Lidocaine and aloe and  Benadryl and it reoslves.  Started when on xarelto.  Hyperkeratotic and on left temporal area. Has noticed this  last few months. Scaliness.  No bleeding.  No h/o skin cancer.  Medical History Kenneth Zimmerman has a past medical history of ADHD (attention deficit hyperactivity disorder), Fatty liver, Hyperlipidemia, and Thrombophlebitis leg.   Outpatient Encounter Medications as of 01/29/2020  Medication Sig  . escitalopram (LEXAPRO) 20 MG tablet Take 1 tablet (20 mg total) by mouth daily.  Marland Kitchen ketoconazole (NIZORAL) 2 % cream Apply BID to affected area  . rivaroxaban (XARELTO) 20 MG TABS tablet TAKE 1 TABLET BY MOUTH DAILY WITH SUPPER  . sildenafil (REVATIO) 20 MG tablet 2 or 3 tablets po as needed  . [DISCONTINUED] escitalopram (LEXAPRO) 20 MG tablet Take 1 tablet (20 mg total) by mouth daily.  . [DISCONTINUED] rivaroxaban (XARELTO) 20 MG TABS tablet TAKE 1 TABLET BY MOUTH DAILY WITH SUPPER   No facility-administered encounter medications on file as of 01/29/2020.     Review of Systems  Constitutional: Negative for chills and fever.  HENT: Negative for congestion, rhinorrhea and sore throat.   Respiratory: Negative for cough, shortness of breath and wheezing.   Cardiovascular: Negative for chest pain and leg swelling.  Gastrointestinal: Negative for abdominal pain, diarrhea, nausea and vomiting.  Genitourinary: Negative for dysuria and frequency.  Skin: Negative for rash.       Skin lesion, left temporal area.  Neurological: Negative for dizziness, weakness and headaches.  Psychiatric/Behavioral: Positive for dysphoric mood (improved with medications). Negative for self-injury, sleep disturbance and suicidal ideas. The patient is not nervous/anxious.      Vitals BP 120/78   Pulse (!) 57   Temp  97.7 F (36.5 C)   Wt 283 lb 6.4 oz (128.5 kg)   SpO2 98%   BMI 33.61 kg/m   Objective:   Physical Exam Vitals and nursing note reviewed.  Constitutional:      General: He is not in acute distress.    Appearance: Normal appearance. He is not ill-appearing.  HENT:     Head: Normocephalic.     Right  Ear: Tympanic membrane normal.     Left Ear: Tympanic membrane normal.     Nose: Nose normal. No congestion.     Mouth/Throat:     Mouth: Mucous membranes are moist.     Pharynx: No oropharyngeal exudate.  Eyes:     Extraocular Movements: Extraocular movements intact.     Conjunctiva/sclera: Conjunctivae normal.     Pupils: Pupils are equal, round, and reactive to light.  Cardiovascular:     Rate and Rhythm: Normal rate and regular rhythm.     Pulses: Normal pulses.     Heart sounds: Normal heart sounds. No murmur heard.   Pulmonary:     Effort: Pulmonary effort is normal. No respiratory distress.     Breath sounds: Normal breath sounds. No wheezing, rhonchi or rales.  Abdominal:     General: Abdomen is flat. Bowel sounds are normal. There is no distension.     Palpations: Abdomen is soft. There is no mass.     Tenderness: There is no abdominal tenderness. There is no guarding or rebound.     Hernia: No hernia is present.  Musculoskeletal:        General: Normal range of motion.     Cervical back: Normal range of motion.     Right lower leg: No edema.     Left lower leg: No edema.  Skin:    General: Skin is warm and dry.     Findings: No rash.     Comments: 1cm oval skin colored scaly area on left temporal area. No bleeding or erythema.  Neurological:     General: No focal deficit present.     Mental Status: He is alert and oriented to person, place, and time.     Cranial Nerves: No cranial nerve deficit.  Psychiatric:        Mood and Affect: Mood normal.        Behavior: Behavior normal.        Thought Content: Thought content normal.        Judgment: Judgment normal.      Assessment and Plan   1. Well adult exam  2. Skin lesion of face - Ambulatory referral to Dermatology  3. Depression, unspecified depression type - escitalopram (LEXAPRO) 20 MG tablet; Take 1 tablet (20 mg total) by mouth daily.  Dispense: 90 tablet; Refill: 1  4. History of deep venous  thrombosis (DVT) of distal vein of left lower extremity - rivaroxaban (XARELTO) 20 MG TABS tablet; TAKE 1 TABLET BY MOUTH DAILY WITH SUPPER  Dispense: 90 tablet; Refill: 3  5. Impaired fasting glucose  6. Elevated serum creatinine   Skin lesion- referral given to derm.  Depression-stable. Cont meds.  H/o left dvt- cont xarelto.  Impaired fasting glucose- cont to monitor and dec carbs in diet.  Elevated CR- will cont to monitor. Reck on next visit.  F/u 1-2 months to discuss ADD symptoms.  Pt to call when ready.   F/u 1 yr for well check.

## 2020-03-12 DIAGNOSIS — Z7184 Encounter for health counseling related to travel: Secondary | ICD-10-CM | POA: Diagnosis not present

## 2020-04-03 ENCOUNTER — Other Ambulatory Visit (HOSPITAL_COMMUNITY): Payer: Self-pay

## 2020-04-13 ENCOUNTER — Other Ambulatory Visit (HOSPITAL_COMMUNITY): Payer: Self-pay

## 2020-08-10 ENCOUNTER — Other Ambulatory Visit (HOSPITAL_COMMUNITY): Payer: Self-pay

## 2020-08-10 MED FILL — Rivaroxaban Tab 20 MG: ORAL | 90 days supply | Qty: 90 | Fill #0 | Status: AC

## 2020-10-04 ENCOUNTER — Other Ambulatory Visit (HOSPITAL_COMMUNITY): Payer: Self-pay

## 2020-10-04 ENCOUNTER — Telehealth: Payer: Self-pay | Admitting: Family Medicine

## 2020-10-04 DIAGNOSIS — Z111 Encounter for screening for respiratory tuberculosis: Secondary | ICD-10-CM

## 2020-10-04 MED FILL — Escitalopram Oxalate Tab 20 MG (Base Equiv): ORAL | 90 days supply | Qty: 90 | Fill #0 | Status: AC

## 2020-10-04 NOTE — Telephone Encounter (Signed)
May have order for chest x-ray, assume he is screening for TB?

## 2020-10-04 NOTE — Telephone Encounter (Signed)
Patient is requesting chest x-ay for employment. He is needing for his new job. He wanting to get order the for this afternoon he states will be in town and would like to get it done today if possible.Please advise

## 2020-10-04 NOTE — Telephone Encounter (Signed)
Chest Xray ordered in Epic- Patient notified- see 32/2/02 message (duplicate message)

## 2020-10-04 NOTE — Telephone Encounter (Signed)
CXR ordered in Epic. Patient notified.

## 2020-10-04 NOTE — Telephone Encounter (Signed)
Patient is requesting chest x-ray foe employment for a job. He would like orders this afternoon if possible sent over to [facility] he will to be in town. Please advise

## 2020-10-05 ENCOUNTER — Other Ambulatory Visit: Payer: Self-pay

## 2020-10-05 ENCOUNTER — Ambulatory Visit (HOSPITAL_COMMUNITY)
Admission: RE | Admit: 2020-10-05 | Discharge: 2020-10-05 | Disposition: A | Discharge status: Home / Self Care | Payer: Medicare Other | Source: Ambulatory Visit | Attending: Family Medicine | Admitting: Family Medicine

## 2020-10-05 DIAGNOSIS — Z111 Encounter for screening for respiratory tuberculosis: Secondary | ICD-10-CM | POA: Diagnosis not present

## 2020-10-06 ENCOUNTER — Telehealth: Payer: Self-pay

## 2020-10-06 NOTE — Telephone Encounter (Signed)
Patient called, states he has received a positive quantiferon TB test. 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   He requests an appointment with a provider to discuss next steps and prefers telehealth. He was advised to send over all pertinent lab work and imaging.   Beryle Flock, RN

## 2020-10-11 ENCOUNTER — Telehealth: Payer: Medicare Other | Admitting: Infectious Disease

## 2020-10-12 ENCOUNTER — Telehealth: Payer: Medicare Other | Admitting: Infectious Disease

## 2020-10-15 ENCOUNTER — Telehealth (INDEPENDENT_AMBULATORY_CARE_PROVIDER_SITE_OTHER): Payer: Medicare Other | Admitting: Infectious Disease

## 2020-10-15 ENCOUNTER — Encounter: Payer: Self-pay | Admitting: Infectious Disease

## 2020-10-15 ENCOUNTER — Other Ambulatory Visit: Payer: Self-pay

## 2020-10-15 DIAGNOSIS — Z227 Latent tuberculosis: Secondary | ICD-10-CM | POA: Diagnosis not present

## 2020-10-15 DIAGNOSIS — I82402 Acute embolism and thrombosis of unspecified deep veins of left lower extremity: Secondary | ICD-10-CM

## 2020-10-15 DIAGNOSIS — D6851 Activated protein C resistance: Secondary | ICD-10-CM | POA: Diagnosis not present

## 2020-10-15 HISTORY — DX: Latent tuberculosis: Z22.7

## 2020-10-15 MED ORDER — ISONIAZID 300 MG PO TABS: 300.0000 mg | ORAL_TABLET | Freq: Every day | ORAL | 8 refills | Status: DC

## 2020-10-15 MED ORDER — VITAMIN B-6 50 MG PO TABS: 50.0000 mg | ORAL_TABLET | Freq: Every day | ORAL | 8 refills | Status: DC

## 2020-10-15 NOTE — Progress Notes (Signed)
Virtual Visit via Video Note  I connected with Kenneth Shirk V. on 10/15/20 at 11:00 AM EDT by a video enabled telemedicine application and verified that I am speaking with the correct person using two identifiers.  Location: Patient: Home Provider: [facility]   I discussed the limitations of evaluation and management by telemedicine and the availability of in person appointments. The patient expressed understanding and agreed to proceed.  History of Present Illness:   Reason for infectious disease consultation: Positive QuantiFERON gold test  Requesting Physician: Dr. Glo Herring (patient)    Patient ID: Kenneth Kind., male    DOB: 29-Jun-1950, 70 y.o.   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  He has a history of factor V Leiden deficiency and deep venous thrombosis and on chronic anticoagulation with Xarelto.  He has had a prior history of fatty liver disease that resolved with him losing 30 pounds of weight.  He has retired but is doing some locum's work both in [place] but but also in [place] along the area near [place].  When he first began working in locum's in [place] they performed a QuantiFERON gold test that was positive.  He then had a repeat test done in [place] which was negative.  His chest x-rays done at the time showed no active cardiopulmonary disease.  He then has subsequently had another positive QuantiFERON gold through a [facility] test in [place] which I was able to view by pulling up the [facility] portal which showed a positive QuantiFERON test on September 29, 2020.  He had initially been told  that the first QuantiFERON gold was probably a false positive.  Note the labs were done from different reference labs so his discordant results are not with the same lab.  I feel given the fact that he has 2 positive QuantiFERON tests with the same company with [facility] that it would be worth treating him for latent tuberculosis.  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  He has not had a recent HIV test though he has had 1 in the last 12 months after he had a needlestick associated with work that was negative.  Past Medical History:  Diagnosis Date   ADHD (attention deficit hyperactivity disorder)    Fatty liver    Hyperlipidemia    TB lung, latent 10/15/2020   Thrombophlebitis leg    left    Past Surgical History:  Procedure Laterality Date   GANGLION CYST EXCISION     HAND SURGERY     repair of laceration of right hand   INFECTED SKIN DEBRIDEMENT     VASECTOMY      Family History  Problem Relation Age of Onset   Cancer Other        colon   Diabetes Father    Diabetes Brother       Social History   Socioeconomic History   Marital status: Married    Spouse name: Not  on file   Number of children: Not on file   Years of education: Not on file   Highest education level: Not on file  Occupational History   Not on file  Tobacco Use   Smoking status: Never   Smokeless tobacco: Never  Vaping Use   Vaping Use: Never used  Substance and Sexual Activity   Alcohol use: Yes    Alcohol/week: 1.0 standard drink    Types: 1 Glasses of wine per week   Drug use: Never   Sexual activity: Not on file  Other Topics Concern   Not on file  Social History Narrative   Not on file   Social Determinants of Health   Financial Resource Strain: Not on file  Food Insecurity: Not on file  Transportation Needs:  Not on file  Physical Activity: Not on file  Stress: Not on file  Social Connections: Not on file    No Known Allergies   Current Outpatient Medications:    escitalopram (LEXAPRO) 20 MG tablet, Take 1 tablet (20 mg total) by mouth daily., Disp: 90 tablet, Rfl: 1   isoniazid (NYDRAZID) 300 MG tablet, Take 1 tablet (300 mg total) by mouth daily., Disp: 30 tablet, Rfl: 8   ketoconazole (NIZORAL) 2 % cream, Apply BID to affected area, Disp: 15 g, Rfl: 2   pyridOXINE (VITAMIN B-6) 50 MG tablet, Take 1 tablet (50 mg total) by mouth daily., Disp: 30 tablet, Rfl: 8   rivaroxaban (XARELTO) 20 MG TABS tablet, TAKE 1 TABLET BY MOUTH DAILY WITH SUPPER, Disp: 90 tablet, Rfl: 3   sildenafil (REVATIO) 20 MG tablet, 2 or 3 tablets po as needed, Disp: 24 tablet, Rfl: 5   ROS: As per history of present illness otherwise 12 point view of systems is negative  Observations/Objective:   Kenneth Zimmerman appeared in no acute distress when I talked him over the video feed.  He showed me his finger which we had treated for osteomyelitis early after I first began working at [employer].     Assessment and Plan:  Latent tuberculosis: Given 2 positive tests through [facility] and his work as a Dietitian I think it is worth treating him for latent tuberculosis.  We discussed various treatment options and I think given the fact that he needs Xarelto I think that rifamycin's are off the table.  Instead we will give him isoniazid 300 mg daily with 50 mg of pyridoxine and aim for 9 months of treatment.  I counseled him about risks for neuropathy that should be attenuated by pyridoxine, optic neuritis and hepatotoxicity.  He had a CMP done recently of primary care which showed normal liver function tests which I have access through reviewing tests in [facility].  I will have him come back to see me the Friday before Thanksgiving and we can check liver function tests at that visit.  DVT with factor V Leiden and  hypercoagulable risk on lifelong anticoagulation he is continue on Xarelto    Follow Up Instructions:    I discussed the assessment and treatment plan with the patient. The patient was provided an opportunity to ask questions and all were answered. The patient agreed with the plan and demonstrated an understanding of the instructions.   The patient was advised to call back or seek an in-person evaluation if the symptoms worsen or if the condition fails to improve as anticipated.  I spent 62 minutes with the patient including than 50% of the time in face to face  counseling of the patient re nature of latent TB, personally reviewing  CXR on October 05, 2020 that shows no pulmonary infiltrates or active cardiopulmonary disease, his CBC and CMP performed through [facility] the QuantiFERON gold tests that were also ordered, along with review of medical records in preparation for the visit and during the visit and in coordination of his care.    Alcide Evener, MD

## 2020-11-06 ENCOUNTER — Other Ambulatory Visit (HOSPITAL_COMMUNITY): Payer: Self-pay

## 2020-11-06 MED FILL — Rivaroxaban Tab 20 MG: ORAL | 90 days supply | Qty: 90 | Fill #1 | Status: AC

## 2020-11-08 ENCOUNTER — Other Ambulatory Visit (HOSPITAL_COMMUNITY): Payer: Self-pay

## 2020-11-19 ENCOUNTER — Other Ambulatory Visit: Payer: Self-pay

## 2020-11-19 ENCOUNTER — Ambulatory Visit: Payer: Medicare Other | Admitting: Infectious Disease

## 2020-11-19 ENCOUNTER — Encounter: Payer: Self-pay | Admitting: Infectious Disease

## 2020-11-19 VITALS — BP 126/78 | HR 53 | Temp 97.5°F | Wt 270.0 lb

## 2020-11-19 DIAGNOSIS — Z227 Latent tuberculosis: Secondary | ICD-10-CM | POA: Diagnosis not present

## 2020-11-19 DIAGNOSIS — K7581 Nonalcoholic steatohepatitis (NASH): Secondary | ICD-10-CM | POA: Diagnosis not present

## 2020-11-19 NOTE — Progress Notes (Signed)
Subjective:  Chief complaint follow-up for treatment for latent tuberculosis  Patient ID: Kenneth Zimmerman., male    DOB: 1950-03-16, 70 y.o.   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  He has a history of factor V Leiden deficiency and deep venous thrombosis and on chronic anticoagulation with Xarelto.  He has had a prior history of fatty liver disease that resolved with him losing 30 pounds of weight.  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  When he first began working in locum's in [place] they performed a QuantiFERON gold test that was positive.  He then had a repeat test done in [place] which was negative.  His chest x-rays done at the time showed no active cardiopulmonary disease.  He then has subsequently had another positive QuantiFERON gold through a [facility] test in [place] which I was able to view by pulling up the [facility] portal which showed a positive QuantiFERON test on September 29, 2020.  He had initially been told that the first QuantiFERON gold was probably a false positive.  Note the labs were done from different reference labs so his discordant results are not with the same lab.  I feel given the fact that he has 2 positive QuantiFERON tests with the same company with [facility] that it would be worth treating him for latent tuberculosis  In talking to [place] today he did do a mission trip in [place] in the last 2 years and he suspects that may be the potential Industrial/product designer.  He does also have a history of intermittent  fatty liver disease on imaging that is resolved with weight loss.  We will repeat check CMP today.  Past Medical History:  Diagnosis Date   ADHD (attention deficit hyperactivity disorder)    Fatty liver    Hyperlipidemia    TB lung, latent 10/15/2020   Thrombophlebitis leg    left    Past Surgical History:  Procedure Laterality Date   GANGLION CYST EXCISION     HAND SURGERY     repair of laceration of right hand   INFECTED SKIN DEBRIDEMENT     VASECTOMY      Family History  Problem Relation Age of Onset   Cancer Other        colon   Diabetes Father    Diabetes Brother       Social History   Socioeconomic History   Marital status: Married    Spouse name: Not on file   Number of children: Not on file   Years of education: Not on file   Highest education level: Not on file  Occupational History   Not on file  Tobacco Use   Smoking status: Never   Smokeless tobacco: Never  Vaping Use   Vaping Use: Never used  Substance and Sexual Activity   Alcohol use: Yes    Alcohol/week: 1.0 standard drink    Types: 1 Glasses of wine per week   Drug use: Never   Sexual activity: Not on file  Other Topics Concern   Not on file  Social History Narrative   Not on file   Social Determinants of Health   Financial Resource Strain: Not on file  Food Insecurity: Not on file  Transportation Needs: Not on file  Physical Activity: Not on file  Stress: Not on file  Social Connections: Not on file    No Known Allergies   Current Outpatient Medications:    escitalopram (LEXAPRO) 20 MG tablet, Take 1 tablet (20 mg total) by mouth daily., Disp: 90 tablet, Rfl: 1   pyridOXINE (VITAMIN B-6) 50 MG tablet, Take 1 tablet (50 mg total) by mouth daily., Disp: 30 tablet, Rfl: 8   rivaroxaban (XARELTO) 20 MG TABS tablet, TAKE 1 TABLET BY MOUTH DAILY WITH SUPPER, Disp: 90 tablet, Rfl: 3   ketoconazole (NIZORAL) 2 % cream, Apply BID to affected area, Disp: 15 g, Rfl: 2   sildenafil  (REVATIO) 20 MG tablet, 2 or 3 tablets po as needed, Disp: 24 tablet, Rfl: 5   Review of Systems  Constitutional:  Negative for activity change, appetite change, chills, diaphoresis, fatigue, fever and unexpected weight change.  HENT:  Negative for congestion, rhinorrhea, sinus pressure, sneezing, sore throat and trouble swallowing.   Eyes:  Negative for photophobia and visual disturbance.  Respiratory:  Negative for cough, chest tightness, shortness of breath, wheezing and stridor.   Cardiovascular:  Negative for chest pain, palpitations and leg swelling.  Gastrointestinal:  Negative for abdominal distention, abdominal pain, anal bleeding, blood in stool, constipation, diarrhea, nausea and vomiting.  Genitourinary:  Negative for difficulty urinating, dysuria, flank pain and hematuria.  Musculoskeletal:  Negative for arthralgias, back pain, gait problem, joint swelling and myalgias.  Skin:  Negative for color change, pallor, rash and wound.  Neurological:  Negative for dizziness, tremors, weakness and light-headedness.  Hematological:  Negative for adenopathy. Does not bruise/bleed easily.  Psychiatric/Behavioral:  Negative for agitation, behavioral problems, confusion, decreased concentration, dysphoric mood and sleep disturbance.       Objective:   Physical Exam Constitutional:      Appearance: He is well-developed.  HENT:     Head: Normocephalic and atraumatic.  Eyes:     Conjunctiva/sclera: Conjunctivae normal.  Cardiovascular:     Rate and Rhythm: Normal rate and regular rhythm.  Pulmonary:     Effort: Pulmonary effort is normal. No respiratory distress.     Breath sounds: No wheezing.  Abdominal:     General: There is no distension.     Palpations: Abdomen is soft.  Musculoskeletal:        General: No tenderness. Normal range of motion.     Cervical back: Normal range of motion and neck supple.  Skin:    General: Skin is warm and dry.     Coloration: Skin is not pale.      Findings: No erythema or rash.  Neurological:     General: No focal deficit present.     Mental Status: He is alert and oriented to person, place, and time.  Psychiatric:        Mood and Affect: Mood normal.        Behavior: Behavior normal.        Thought Content: Thought content normal.        Judgment: Judgment normal.          Assessment & Plan:   Latent TB:  Checking a CMP with [facility] today.  Will continue isoniazid and B6  I will see Patryk in  follow-up in 8 months via video feed  NASH: has been intermittent but is a good reason to check LFTs today

## 2020-11-19 NOTE — Patient Instructions (Signed)
Followup can be video

## 2020-11-20 LAB — COMPLETE METABOLIC PANEL WITH GFR
AG Ratio: 1.7 (calc) (ref 1.0–2.5)
ALT: 33 U/L (ref 9–46)
AST: 28 U/L (ref 10–35)
Albumin: 4.3 g/dL (ref 3.6–5.1)
Alkaline phosphatase (APISO): 53 U/L (ref 35–144)
BUN: 20 mg/dL (ref 7–25)
CO2: 28 mmol/L (ref 20–32)
Calcium: 9.5 mg/dL (ref 8.6–10.3)
Chloride: 103 mmol/L (ref 98–110)
Creat: 1.18 mg/dL (ref 0.70–1.35)
Globulin: 2.5 g/dL (calc) (ref 1.9–3.7)
Glucose, Bld: 98 mg/dL (ref 65–99)
Potassium: 5 mmol/L (ref 3.5–5.3)
Sodium: 139 mmol/L (ref 135–146)
Total Bilirubin: 0.6 mg/dL (ref 0.2–1.2)
Total Protein: 6.8 g/dL (ref 6.1–8.1)
eGFR: 67 mL/min/{1.73_m2} (ref >=60)

## 2021-01-24 DIAGNOSIS — H5213 Myopia, bilateral: Secondary | ICD-10-CM | POA: Diagnosis not present

## 2021-03-01 ENCOUNTER — Other Ambulatory Visit (HOSPITAL_COMMUNITY): Payer: Self-pay

## 2021-03-01 ENCOUNTER — Other Ambulatory Visit: Payer: Self-pay | Admitting: Family Medicine

## 2021-03-01 DIAGNOSIS — Z86718 Personal history of other venous thrombosis and embolism: Secondary | ICD-10-CM

## 2021-03-01 MED ORDER — RIVAROXABAN 20 MG PO TABS
20.0000 mg | ORAL_TABLET | Freq: Every day | ORAL | 1 refills | Status: DC
  Filled 2021-03-01: qty 90, 90d supply, fill #0
  Filled 2021-06-28: qty 90, 90d supply, fill #1

## 2021-03-01 NOTE — Telephone Encounter (Signed)
May have 90-day with 1 refill needs follow-up office visit

## 2021-03-22 ENCOUNTER — Other Ambulatory Visit (HOSPITAL_COMMUNITY): Payer: Self-pay

## 2021-03-22 ENCOUNTER — Other Ambulatory Visit: Payer: Self-pay | Admitting: Infectious Disease

## 2021-03-22 MED ORDER — DOXYCYCLINE HYCLATE 100 MG PO TABS
100.0000 mg | ORAL_TABLET | Freq: Every day | ORAL | 1 refills | Status: DC
Start: 2021-03-22 — End: 2021-10-18
  Filled 2021-03-22: qty 42, 42d supply, fill #0

## 2021-03-23 ENCOUNTER — Other Ambulatory Visit (HOSPITAL_COMMUNITY): Payer: Self-pay

## 2021-06-27 ENCOUNTER — Other Ambulatory Visit: Payer: Self-pay | Admitting: Family Medicine

## 2021-06-27 ENCOUNTER — Other Ambulatory Visit (HOSPITAL_COMMUNITY): Payer: Self-pay

## 2021-06-27 ENCOUNTER — Telehealth: Payer: Self-pay | Admitting: Family Medicine

## 2021-06-27 DIAGNOSIS — F32A Depression, unspecified: Secondary | ICD-10-CM

## 2021-06-27 MED ORDER — ESCITALOPRAM OXALATE 20 MG PO TABS
20.0000 mg | ORAL_TABLET | Freq: Every day | ORAL | 1 refills | Status: DC
  Filled 2021-06-27: qty 90, 90d supply, fill #0

## 2021-06-27 NOTE — Telephone Encounter (Signed)
Nurses Refill of his medication was sent into [facility] I recommend a follow-up office visit by fall time He has recently moved to [place] but is coming back here intermittently We are more than happy to do his wellness by fall time here He can schedule at his convenience (If he gets established in [place] he can get through them) Thanks-Dr. Lorin Picket

## 2021-06-28 ENCOUNTER — Other Ambulatory Visit (HOSPITAL_COMMUNITY): Payer: Self-pay

## 2021-09-19 ENCOUNTER — Other Ambulatory Visit (HOSPITAL_COMMUNITY): Payer: Self-pay

## 2021-10-17 ENCOUNTER — Telehealth: Payer: Self-pay

## 2021-10-17 DIAGNOSIS — Z227 Latent tuberculosis: Secondary | ICD-10-CM

## 2021-10-17 MED ORDER — ISONIAZID 300 MG PO TABS: 300.0000 mg | ORAL_TABLET | Freq: Every day | ORAL | 0 refills | Status: AC

## 2021-10-17 NOTE — Telephone Encounter (Signed)
Received refill request for Isoniazid. Per last note patient was supposed to follow up in July. Spoke with patient and schedule video visit for tomorrow. Would like to know if we can send in one more refill to pharmacy.  Would also like to discuss covid exposure. States he was with someone yesterday for an hour who tested positive for Covid. Does not have any symptoms. Tested negative today.  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  Leatrice Jewels, RMA

## 2021-10-17 NOTE — Addendum Note (Signed)
Addended by: Leatrice Jewels on: 10/17/2021 02:07 PM   Modules accepted: Orders

## 2021-10-18 ENCOUNTER — Encounter: Payer: Self-pay | Admitting: Infectious Disease

## 2021-10-18 ENCOUNTER — Other Ambulatory Visit: Payer: Self-pay

## 2021-10-18 ENCOUNTER — Telehealth (INDEPENDENT_AMBULATORY_CARE_PROVIDER_SITE_OTHER): Payer: Medicare Other | Admitting: Infectious Disease

## 2021-10-18 DIAGNOSIS — R058 Other specified cough: Secondary | ICD-10-CM | POA: Diagnosis not present

## 2021-10-18 DIAGNOSIS — Z7184 Encounter for health counseling related to travel: Secondary | ICD-10-CM

## 2021-10-18 DIAGNOSIS — Z227 Latent tuberculosis: Secondary | ICD-10-CM

## 2021-10-18 DIAGNOSIS — Z7185 Encounter for immunization safety counseling: Secondary | ICD-10-CM

## 2021-10-18 DIAGNOSIS — Z20822 Contact with and (suspected) exposure to covid-19: Secondary | ICD-10-CM

## 2021-10-18 HISTORY — DX: Contact with and (suspected) exposure to covid-19: Z20.822

## 2021-10-18 MED ORDER — DOXYCYCLINE HYCLATE 100 MG PO TABS: 100.0000 mg | ORAL_TABLET | Freq: Every day | ORAL | 1 refills | Status: DC

## 2021-10-18 NOTE — Progress Notes (Signed)
Virtual Visit via Video Note  I connected with [name] on 10/18/21 at  9:00 AM EDT by a video enabled telemedicine application and verified that I am speaking with the correct person using two identifiers.  Location: Patient: Home in the [place] Provider: [facility]   I discussed the limitations of evaluation and management by telemedicine and the availability of in person appointments. The patient expressed understanding and agreed to proceed.  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 He did test positive for TB with 2 QuantiFERON gold test that were positive chest x-ray were negative.  We initiated treatment with isoniazid and he has now completed 9 months of therapy.  He says that he has gained at least 30 or 40 pounds in the past year.  He feels that this may be contributing to some acid reflux and has noticed sometimes a slight subtle cough when he gets up in the morning.  Otherwise he has no pulmonary symptoms.  He is getting ready to go another trip to the [place] in November and would like to get back onto malaria prophylaxis prior to the trip trip.  He was exposed to another person with confirmed positive COVID on Friday.  We went over CDC recommendations which are for him to mask for 10 days from the exposure and to test certainly by October 23 if he is still asymptomatic.  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  12 point ROS is otherwise negative  Past Medical History:  Diagnosis Date   ADHD (attention deficit hyperactivity disorder)    Close exposure to COVID-19 virus 10/18/2021   Fatty liver    Hyperlipidemia    TB lung, latent 10/15/2020   Thrombophlebitis leg    left    Past Surgical History:  Procedure Laterality Date   GANGLION CYST EXCISION     HAND SURGERY      repair of laceration of right hand   INFECTED SKIN DEBRIDEMENT     VASECTOMY      Family History  Problem Relation Age of Onset   Cancer Other        colon   Diabetes Father    Diabetes Brother       Social History   Socioeconomic History   Marital status: Married    Spouse name: Not on file   Number of children: Not on file   Years of education: Not on file   Highest education level: Not on file  Occupational History   Not on file  Tobacco Use   Smoking status: Never   Smokeless tobacco: Never  Vaping Use   Vaping Use: Never used  Substance and Sexual Activity   Alcohol use: Yes    Alcohol/week: 1.0 standard drink of alcohol    Types: 1 Glasses of wine per week   Drug use: Never   Sexual activity: Not on file  Other Topics Concern   Not on file  Social History Narrative   Not on file   Social Determinants of Health   Financial Resource Strain: Not on file  Food Insecurity: Not on file  Transportation Needs: Not on file  Physical Activity: Not on file  Stress: Not on file  Social Connections: Not on file    No Known Allergies   Current Outpatient Medications:  doxycycline (VIBRA-TABS) 100 MG tablet, Take 1 tablet (100 mg total) by mouth daily. Begin 3 days prior to travel and continue for 28 days after return, Disp: 60 tablet, Rfl: 1   escitalopram (LEXAPRO) 20 MG tablet, Take 1 tablet (20 mg total) by mouth daily., Disp: 90 tablet, Rfl: 1   isoniazid (NYDRAZID) 300 MG tablet, Take 1 tablet (300 mg total) by mouth daily., Disp: 30 tablet, Rfl: 0   ketoconazole (NIZORAL) 2 % cream, Apply BID to affected area, Disp: 15 g, Rfl: 2   pyridOXINE (VITAMIN B-6) 50 MG tablet, Take 1 tablet (50 mg total) by mouth daily., Disp: 30 tablet, Rfl: 8   rivaroxaban (XARELTO) 20 MG TABS tablet, Take 1 tablet (20 mg total) by mouth daily with supper., Disp: 90 tablet, Rfl: 1   sildenafil (REVATIO) 20 MG tablet, 2 or 3 tablets po as needed, Disp: 24 tablet, Rfl: 5     Observations/Objective: Tylan appeared to be in good spirits on the video feed  Assessment and Plan: Latent TB: He is finished 9 months of therapy.  Subtle cough: I do not think this is concerning for active TB.  Exposure to COVID-19 he is to mask for 10 days from exposure test October 23.  Malaria prophylaxis I have sent in doxycycline to his pharmacy that he can initiate 3 days prior to going to the [place] and continue for a month after he returns.  Vaccine also recommended updated COVID-19 vaccine when he is able to get this.    Follow Up Instructions:    I discussed the assessment and treatment plan with the patient. The patient was provided an opportunity to ask questions and all were answered. The patient agreed with the plan and demonstrated an understanding of the instructions.   The patient was advised to call back or seek an in-person evaluation if the symptoms worsen or if the condition fails to improve as anticipated.  I spent 34 minutes with the patient including than 50% of the time in face to face counseling of the patient has latent TB exposure to COVID-19 malaria prophylaxis vaccine counseling along with review of medical records in preparation for the visit and during the visit and in coordination of his care.    Alcide Evener, MD

## 2022-08-01 ENCOUNTER — Encounter: Payer: Self-pay | Admitting: Family Medicine

## 2022-08-01 ENCOUNTER — Telehealth: Payer: Self-pay | Admitting: Family Medicine

## 2022-08-01 NOTE — Telephone Encounter (Signed)
Patient would like labs done before he leaves on Thursday.

## 2022-08-02 ENCOUNTER — Other Ambulatory Visit: Payer: Self-pay | Admitting: Family Medicine

## 2022-08-02 ENCOUNTER — Other Ambulatory Visit: Payer: Self-pay

## 2022-08-02 DIAGNOSIS — Z79899 Other long term (current) drug therapy: Secondary | ICD-10-CM

## 2022-08-02 DIAGNOSIS — Z125 Encounter for screening for malignant neoplasm of prostate: Secondary | ICD-10-CM

## 2022-08-02 DIAGNOSIS — R739 Hyperglycemia, unspecified: Secondary | ICD-10-CM

## 2022-08-02 DIAGNOSIS — Z0000 Encounter for general adult medical examination without abnormal findings: Secondary | ICD-10-CM

## 2022-08-02 NOTE — Telephone Encounter (Signed)
Called pt to inform him labs have been placed. Pt requested he get a PSA.

## 2022-08-23 ENCOUNTER — Encounter: Payer: Medicare Other | Admitting: Family Medicine

## 2022-08-31 ENCOUNTER — Ambulatory Visit (INDEPENDENT_AMBULATORY_CARE_PROVIDER_SITE_OTHER): Payer: Medicare Other | Admitting: Family Medicine

## 2022-08-31 VITALS — BP 124/81 | HR 64 | Temp 98.4°F | Ht 76.5 in | Wt 287.9 lb

## 2022-08-31 DIAGNOSIS — Z227 Latent tuberculosis: Secondary | ICD-10-CM

## 2022-08-31 DIAGNOSIS — E785 Hyperlipidemia, unspecified: Secondary | ICD-10-CM | POA: Diagnosis not present

## 2022-08-31 DIAGNOSIS — K760 Fatty (change of) liver, not elsewhere classified: Secondary | ICD-10-CM | POA: Insufficient documentation

## 2022-08-31 DIAGNOSIS — N529 Male erectile dysfunction, unspecified: Secondary | ICD-10-CM

## 2022-08-31 DIAGNOSIS — Z0001 Encounter for general adult medical examination with abnormal findings: Secondary | ICD-10-CM | POA: Diagnosis not present

## 2022-08-31 DIAGNOSIS — D6851 Activated protein C resistance: Secondary | ICD-10-CM

## 2022-08-31 DIAGNOSIS — Z0000 Encounter for general adult medical examination without abnormal findings: Secondary | ICD-10-CM

## 2022-08-31 DIAGNOSIS — Z86718 Personal history of other venous thrombosis and embolism: Secondary | ICD-10-CM

## 2022-08-31 DIAGNOSIS — Z1211 Encounter for screening for malignant neoplasm of colon: Secondary | ICD-10-CM

## 2022-08-31 DIAGNOSIS — R7303 Prediabetes: Secondary | ICD-10-CM | POA: Insufficient documentation

## 2022-08-31 DIAGNOSIS — Z8249 Family history of ischemic heart disease and other diseases of the circulatory system: Secondary | ICD-10-CM

## 2022-08-31 MED ORDER — SILDENAFIL CITRATE 20 MG PO TABS: 40.0000 mg | ORAL_TABLET | Freq: Every day | ORAL | 5 refills | Status: DC | PRN

## 2022-08-31 MED ORDER — ESCITALOPRAM OXALATE 10 MG PO TABS: 10.0000 mg | ORAL_TABLET | Freq: Every day | ORAL | 3 refills | Status: DC

## 2022-08-31 MED ORDER — ROSUVASTATIN CALCIUM 5 MG PO TABS: 5.0000 mg | ORAL_TABLET | Freq: Every day | ORAL | 3 refills | Status: DC

## 2022-08-31 MED ORDER — DOXYCYCLINE HYCLATE 100 MG PO TABS: 100.0000 mg | ORAL_TABLET | Freq: Every day | ORAL | 1 refills | Status: DC

## 2022-08-31 MED ORDER — KETOCONAZOLE 2 % EX CREA: TOPICAL_CREAM | CUTANEOUS | 2 refills | Status: DC

## 2022-08-31 MED ORDER — RIVAROXABAN 20 MG PO TABS: 20.0000 mg | ORAL_TABLET | Freq: Every day | ORAL | 3 refills | Status: DC

## 2022-08-31 NOTE — Assessment & Plan Note (Signed)
Labs reviewed with the patient.  Starting Crestor regarding hyperlipidemia and elevated ASCVD risk.  He would like CT coronary calcium scoring as well.  Preventative health care updated in health maintenance section.  Placing referral to GI for colonoscopy.

## 2022-08-31 NOTE — Progress Notes (Signed)
Subjective:  Patient ID: Kenneth Burrow., male    DOB: December 27, 1950  Age: 72 y.o. MRN: [id number]  CC: Annual exam   HPI:  72 year old male with a history of DVT and factor V Leiden mutation on chronic anticoagulation, hepatic steatosis, hyperlipidemia, prediabetes, history of latent TB presents for an annual exam.  Patient states that he is overall doing well.  He needs refill on Xarelto.  Also requesting refill on sildenafil.  Additionally, would like to restart Lexapro.  He has taken this in the past with good results.  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  In regards to his preventative health care, he has had prior hepatitis C screening.  Had shingles vaccine in 2021.  Also had tetanus vaccine in 2021.  Reports that he had a prior pneumonia vaccine in 2021.  He is unsure of which one he got.  He is in need of screening colonoscopy.  He would like me to replace a referral for this today.  Patient's recent labs were obtained and revealed prediabetes with an A1c of 6.0 and hyperlipidemia with an LDL of 117.  He has ASCVD risk score is below:  The 10-year ASCVD risk score (Arnett DK, et al., 2019) is: 19.1%   Values used to calculate the score:     Age: 29 years     Sex: Male     Is Non-Hispanic African American: No     Diabetic: No     Tobacco smoker: No     Systolic Blood Pressure: 124 mmHg     Is BP treated: No     HDL Cholesterol: 40 mg/dL     Total Cholesterol: 179 mg/dL   Patient Active Problem List   Diagnosis Date Noted   Hepatic steatosis 08/31/2022   Hyperlipidemia 08/31/2022   Prediabetes 08/31/2022   Annual physical exam 08/31/2022   TB lung, latent 10/15/2020   Heterozygous factor V Leiden mutation (HCC) 10/22/2012    Social Hx   Social History   Socioeconomic History   Marital status: Married    Spouse name: Not on file   Number of children: Not on file   Years of education: Not on file   Highest education  level: Not on file  Occupational History   Not on file  Tobacco Use   Smoking status: Never   Smokeless tobacco: Never  Vaping Use   Vaping status: Never Used  Substance and Sexual Activity   Alcohol use: Yes    Alcohol/week: 1.0 standard drink of alcohol    Types: 1 Glasses of wine per week   Drug use: Never   Sexual activity: Not on file  Other Topics Concern   Not on file  Social History Narrative   Not on file   Social Determinants of Health   Financial Resource Strain: Not on file  Food Insecurity: Not on file  Transportation Needs: Not on file  Physical Activity: Not on file  Stress: Not on file  Social Connections: Not on file    Review of Systems  Respiratory: Negative.    Cardiovascular: Negative.    Objective:  BP 124/81   Pulse 64   Temp 98.4 F (36.9 C)   Ht 6' 4.5" (1.943 m)   Wt 287 lb 14.4 oz (130.6 kg)   SpO2 98%   BMI 34.59 kg/m      08/31/2022   11:09 AM 11/19/2020    8:56 AM 01/29/2020  1:23 PM  BP/Weight  Systolic BP 124 126 120  Diastolic BP 81 78 78  Wt. (Lbs) 287.9 270 283.4  BMI 34.59 kg/m2 32.02 kg/m2 33.61 kg/m2    Physical Exam Vitals and nursing note reviewed.  Constitutional:      General: He is not in acute distress.    Appearance: Normal appearance.  HENT:     Head: Normocephalic and atraumatic.  Eyes:     General:        Right eye: No discharge.        Left eye: No discharge.     Conjunctiva/sclera: Conjunctivae normal.  Cardiovascular:     Rate and Rhythm: Normal rate and regular rhythm.  Pulmonary:     Effort: Pulmonary effort is normal.     Breath sounds: Normal breath sounds. No wheezing, rhonchi or rales.  Abdominal:     General: There is no distension.     Palpations: Abdomen is soft.  Neurological:     Mental Status: He is alert.  Psychiatric:        Mood and Affect: Mood normal.        Behavior: Behavior normal.     Lab Results  Component Value Date   WBC 6.1 08/03/2022   HGB 15.6 08/03/2022    HCT 48.7 08/03/2022   PLT 207 08/03/2022   GLUCOSE 103 (H) 08/03/2022   CHOL 179 08/03/2022   TRIG 123 08/03/2022   HDL 40 08/03/2022   LDLCALC 117 (H) 08/03/2022   ALT 40 08/03/2022   AST 31 08/03/2022   NA 140 08/03/2022   K 4.4 08/03/2022   CL 104 08/03/2022   CREATININE 1.20 08/03/2022   BUN 15 08/03/2022   CO2 21 08/03/2022   INR 1.6 02/06/2013   HGBA1C 6.0 (H) 08/03/2022     Assessment & Plan:   Problem List Items Addressed This Visit       Hematopoietic and Hemostatic   Heterozygous factor V Leiden mutation (HCC)    History of DVT.  Refilling Xarelto.  Lifelong anticoagulation.        Other   Annual physical exam - Primary    Labs reviewed with the patient.  Starting Crestor regarding hyperlipidemia and elevated ASCVD risk.  He would like CT coronary calcium scoring as well.  Preventative health care updated in health maintenance section.  Placing referral to GI for colonoscopy.      Hyperlipidemia    Uncontrolled.  Elevated ASCVD risk score.  Starting Crestor.      Relevant Medications   rivaroxaban (XARELTO) 20 MG TABS tablet   sildenafil (REVATIO) 20 MG tablet   rosuvastatin (CRESTOR) 5 MG tablet   Other Relevant Orders   CT CARDIAC SCORING (SELF PAY ONLY)   Prediabetes   TB lung, latent   Relevant Orders   DG Chest 2 View   Other Visit Diagnoses     History of deep venous thrombosis (DVT) of distal vein of left lower extremity       Relevant Medications   rivaroxaban (XARELTO) 20 MG TABS tablet   Erectile dysfunction, unspecified erectile dysfunction type       Relevant Medications   sildenafil (REVATIO) 20 MG tablet   Encounter for screening colonoscopy       Relevant Orders   Ambulatory referral to Gastroenterology   Family history of heart disease       Relevant Orders   CT CARDIAC SCORING (SELF PAY ONLY)       Meds ordered  this encounter  Medications   doxycycline (VIBRA-TABS) 100 MG tablet    Sig: Take 1 tablet (100 mg total)  by mouth daily. Begin 3 days prior to travel and continue for 28 days after return    Dispense:  60 tablet    Refill:  1    Please place on file. Used for mission trips (ppx)   escitalopram (LEXAPRO) 10 MG tablet    Sig: Take 1 tablet (10 mg total) by mouth daily.    Dispense:  90 tablet    Refill:  3   rivaroxaban (XARELTO) 20 MG TABS tablet    Sig: Take 1 tablet (20 mg total) by mouth daily with supper.    Dispense:  90 tablet    Refill:  3   sildenafil (REVATIO) 20 MG tablet    Sig: Take 2-3 tablets (40-60 mg total) by mouth daily as needed.    Dispense:  30 tablet    Refill:  5   rosuvastatin (CRESTOR) 5 MG tablet    Sig: Take 1 tablet (5 mg total) by mouth daily.    Dispense:  90 tablet    Refill:  3   ketoconazole (NIZORAL) 2 % cream    Sig: Apply BID to affected area    Dispense:  60 g    Refill:  2    Follow-up:  Annually  Everlene Other DO [facility]

## 2022-08-31 NOTE — Assessment & Plan Note (Signed)
Uncontrolled.  Elevated ASCVD risk score.  Starting Crestor.

## 2022-08-31 NOTE — Assessment & Plan Note (Signed)
History of DVT.  Refilling Xarelto.  Lifelong anticoagulation.

## 2022-09-05 ENCOUNTER — Encounter: Payer: Self-pay | Admitting: *Deleted

## 2022-09-18 ENCOUNTER — Ambulatory Visit (HOSPITAL_COMMUNITY)
Admission: RE | Admit: 2022-09-18 | Discharge: 2022-09-18 | Disposition: A | Discharge status: Home / Self Care | Payer: Medicare Other | Source: Ambulatory Visit | Attending: Family Medicine | Admitting: Family Medicine

## 2022-09-18 DIAGNOSIS — Z8249 Family history of ischemic heart disease and other diseases of the circulatory system: Secondary | ICD-10-CM | POA: Insufficient documentation

## 2022-09-18 DIAGNOSIS — E785 Hyperlipidemia, unspecified: Secondary | ICD-10-CM | POA: Insufficient documentation

## 2022-09-19 ENCOUNTER — Encounter: Payer: Self-pay | Admitting: Orthopaedic Surgery

## 2022-09-19 ENCOUNTER — Other Ambulatory Visit (INDEPENDENT_AMBULATORY_CARE_PROVIDER_SITE_OTHER): Payer: Medicare Other

## 2022-09-19 ENCOUNTER — Ambulatory Visit: Payer: Medicare Other | Admitting: Orthopaedic Surgery

## 2022-09-19 VITALS — BP 133/80 | HR 85 | Ht 76.5 in | Wt 285.0 lb

## 2022-09-19 DIAGNOSIS — S86212A Strain of muscle(s) and tendon(s) of anterior muscle group at lower leg level, left leg, initial encounter: Secondary | ICD-10-CM

## 2022-09-19 DIAGNOSIS — E669 Obesity, unspecified: Secondary | ICD-10-CM | POA: Insufficient documentation

## 2022-09-19 DIAGNOSIS — J209 Acute bronchitis, unspecified: Secondary | ICD-10-CM | POA: Insufficient documentation

## 2022-09-19 DIAGNOSIS — M79672 Pain in left foot: Secondary | ICD-10-CM | POA: Diagnosis not present

## 2022-09-19 DIAGNOSIS — G8929 Other chronic pain: Secondary | ICD-10-CM | POA: Diagnosis not present

## 2022-09-19 DIAGNOSIS — D6851 Activated protein C resistance: Secondary | ICD-10-CM | POA: Insufficient documentation

## 2022-09-19 DIAGNOSIS — M79671 Pain in right foot: Secondary | ICD-10-CM

## 2022-09-19 NOTE — Progress Notes (Signed)
Subjective:    Patient ID: Kenneth Zimmerman., male    DOB: [birth year], 72 y.o.   MRN: [id number]  HPI About two weeks ago he had a tight compression of his left shoe over the dorsum of the left foot that bothered him but he kept going.  He tolerated the pain until he finished his activities about two hours or more later.  After removing his shoe, he noted pain in the left dorsal foot was still present.  He had slight swelling.  He used ice and rest and elevation.  Since then he has noted a "flop" to the left foot when walking.  He has caught his left foot on the stairs when going up the stairs.  He has dorsiflexion of the left foot but not as much on the left.  He has more dorsiflexion of the toes than of the ankle itself.  He is not getting any better.  His pain is decreased but his function is worse.  He has no ecchymosis or redness.  He tender over the dorsum medial foot on the left.   Review of Systems  Constitutional:  Positive for activity change.  Musculoskeletal:  Positive for arthralgias and gait problem.  All other systems reviewed and are negative. For Review of Systems, all other systems reviewed and are negative.  The following is a summary of the past history medically, past history surgically, known current medicines, social history and family history.  This information is gathered electronically by the computer from prior information and documentation.  I review this each visit and have found including this information at this point in the chart is beneficial and informative.   Past Medical History:  Diagnosis Date   ADHD (attention deficit hyperactivity disorder)    Close exposure to COVID-19 virus 10/18/2021   Fatty liver    Hyperlipidemia    TB lung, latent 10/15/2020   Thrombophlebitis leg    left    Past Surgical History:  Procedure Laterality Date   GANGLION CYST EXCISION     HAND SURGERY     repair of laceration of right hand   INFECTED SKIN DEBRIDEMENT      VASECTOMY      Current Outpatient Medications on File Prior to Visit  Medication Sig Dispense Refill   doxycycline (VIBRA-TABS) 100 MG tablet Take 1 tablet (100 mg total) by mouth daily. Begin 3 days prior to travel and continue for 28 days after return 60 tablet 1   escitalopram (LEXAPRO) 10 MG tablet Take 1 tablet (10 mg total) by mouth daily. 90 tablet 3   ketoconazole (NIZORAL) 2 % cream Apply BID to affected area 60 g 2   rivaroxaban (XARELTO) 20 MG TABS tablet Take 1 tablet (20 mg total) by mouth daily with supper. 90 tablet 3   rosuvastatin (CRESTOR) 5 MG tablet Take 1 tablet (5 mg total) by mouth daily. 90 tablet 3   sildenafil (REVATIO) 20 MG tablet Take 2-3 tablets (40-60 mg total) by mouth daily as needed. 30 tablet 5   No current facility-administered medications on file prior to visit.    Social History   Socioeconomic History   Marital status: Married    Spouse name: Not on file   Number of children: Not on file   Years of education: Not on file   Highest education level: Not on file  Occupational History   Not on file  Tobacco Use   Smoking status: Never   Smokeless tobacco: Never  Vaping Use   Vaping status: Never Used  Substance and Sexual Activity   Alcohol use: Yes    Alcohol/week: 1.0 standard drink of alcohol    Types: 1 Glasses of wine per week   Drug use: Never   Sexual activity: Not on file  Other Topics Concern   Not on file  Social History Narrative   Not on file   Social Determinants of Health   Financial Resource Strain: Not on file  Food Insecurity: Not on file  Transportation Needs: Not on file  Physical Activity: Not on file  Stress: Not on file  Social Connections: Not on file  Intimate Partner Violence: Not on file    Family History  Problem Relation Age of Onset   Cancer Other        colon   Diabetes Father    Diabetes Brother     BP 133/80   Pulse 85   Ht 6' 4.5" (1.943 m)   Wt 285 lb (129.3 kg)   BMI 34.24 kg/m    Body mass index is 34.24 kg/m.      Objective:   Physical Exam Vitals and nursing note reviewed. Exam conducted with a chaperone present.  Constitutional:      Appearance: He is well-developed.  HENT:     Head: Normocephalic and atraumatic.  Eyes:     Conjunctiva/sclera: Conjunctivae normal.     Pupils: Pupils are equal, round, and reactive to light.  Cardiovascular:     Rate and Rhythm: Normal rate and regular rhythm.  Pulmonary:     Effort: Pulmonary effort is normal.  Abdominal:     Palpations: Abdomen is soft.  Musculoskeletal:     Cervical back: Normal range of motion and neck supple.       Legs:  Skin:    General: Skin is warm and dry.  Neurological:     Mental Status: He is alert and oriented to person, place, and time.     Cranial Nerves: No cranial nerve deficit.     Motor: No abnormal muscle tone.     Coordination: Coordination normal.     Deep Tendon Reflexes: Reflexes are normal and symmetric. Reflexes normal.  Psychiatric:        Behavior: Behavior normal.        Thought Content: Thought content normal.        Judgment: Judgment normal.   X-rays were done of the left foot, reported separately.        Assessment & Plan:   Encounter Diagnoses  Name Primary?   Acute foot pain, right Yes   Traumatic tear of left anterior tibialis tendon    I would like to get a MRI of the left foot/ankle area.  I feel he has tear of the anterior tibialis on the left.    Dr. Emelda Fear was able to get the MRI scheduled tomorrow at 10 am.  I will have him see Dr. Romeo Apple if surgery is needed.  Call if any problem.  Precautions discussed.  Electronically Signed Darreld Mclean, MD 9/17/20243:29 PM

## 2022-09-20 ENCOUNTER — Ambulatory Visit (HOSPITAL_COMMUNITY)
Admission: RE | Admit: 2022-09-20 | Discharge: 2022-09-20 | Disposition: A | Discharge status: Home / Self Care | Payer: Medicare Other | Source: Ambulatory Visit | Attending: Orthopaedic Surgery | Admitting: Orthopaedic Surgery

## 2022-09-20 DIAGNOSIS — M79671 Pain in right foot: Secondary | ICD-10-CM | POA: Insufficient documentation

## 2022-09-21 ENCOUNTER — Telehealth: Payer: Self-pay | Admitting: Radiology

## 2022-09-21 DIAGNOSIS — M79671 Pain in right foot: Secondary | ICD-10-CM

## 2022-09-21 NOTE — Telephone Encounter (Signed)
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  He will need surgery. ----- Message ----- From: Interface, Rad Results In Sent: 09/20/2022  12:07 PM EDT To: Darreld Mclean, MD

## 2022-09-21 NOTE — Telephone Encounter (Signed)
-----   Message from [place] sent at 09/21/2022  4:04 PM EDT ----- Mmm   Read the note and mri   I ll say boot ----- Message ----- From: Caffie Damme, RT Sent: 09/21/2022   2:09 PM EDT To: Vickki Hearing, MD  I did, he is asking if he needs a boot or AFO he can not fully dorsiflex his foot ----- Message ----- From: Vickki Hearing, MD Sent: 09/21/2022  12:22 PM EDT To: Caffie Damme, RT  Send to Lajoyce Corners ----- Message ----- From: Caffie Damme, RT Sent: 09/21/2022   8:10 AM EDT To: Vickki Hearing, MD  Dr Hilda Lias wants to know if you will see or send to Dr Lajoyce Corners for surgery ----- Message ----- From: Darreld Mclean, MD Sent: 09/21/2022   7:38 AM EDT To: Caffie Damme, RT  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  He will need surgery. ----- Message ----- From: Interface, Rad Results In Sent: 09/20/2022  12:07 PM EDT To: Darreld Mclean, MD

## 2022-09-26 ENCOUNTER — Other Ambulatory Visit: Payer: Self-pay

## 2022-09-26 ENCOUNTER — Ambulatory Visit (INDEPENDENT_AMBULATORY_CARE_PROVIDER_SITE_OTHER): Payer: Self-pay | Admitting: Orthopedic Surgery

## 2022-09-26 ENCOUNTER — Encounter (HOSPITAL_COMMUNITY): Payer: Self-pay | Admitting: Orthopedic Surgery

## 2022-09-26 ENCOUNTER — Encounter: Payer: Self-pay | Admitting: Orthopedic Surgery

## 2022-09-26 DIAGNOSIS — S86212A Strain of muscle(s) and tendon(s) of anterior muscle group at lower leg level, left leg, initial encounter: Secondary | ICD-10-CM

## 2022-09-26 NOTE — Anesthesia Preprocedure Evaluation (Signed)
Anesthesia Evaluation  Patient identified by MRN, date of birth, ID band Patient awake    Reviewed: Allergy & Precautions, NPO status , Patient's Chart, lab work & pertinent test results  History of Anesthesia Complications Negative for: history of anesthetic complications  Airway Mallampati: I  TM Distance: >3 FB Neck ROM: Full    Dental  (+) Dental Advisory Given, Teeth Intact   Pulmonary  H/o TB   breath sounds clear to auscultation       Cardiovascular (-) hypertension(-) angina + DVT   Rhythm:Regular Rate:Normal     Neuro/Psych negative neurological ROS     GI/Hepatic negative GI ROS, Neg liver ROS,,,  Endo/Other  BMI 34  Renal/GU negative Renal ROS     Musculoskeletal   Abdominal   Peds  Hematology  (+) Blood dyscrasia (Factor V Leiden) xarelto   Anesthesia Other Findings   Reproductive/Obstetrics                             Anesthesia Physical Anesthesia Plan  ASA: 3  Anesthesia Plan: General   Post-op Pain Management: Regional block* and Tylenol PO (pre-op)*   Induction: Intravenous  PONV Risk Score and Plan: 2 and Ondansetron, Dexamethasone and Treatment may vary due to age or medical condition  Airway Management Planned: LMA  Additional Equipment: None  Intra-op Plan:   Post-operative Plan:   Informed Consent: I have reviewed the patients History and Physical, chart, labs and discussed the procedure including the risks, benefits and alternatives for the proposed anesthesia with the patient or authorized representative who has indicated his/her understanding and acceptance.     Dental advisory given  Plan Discussed with: CRNA and Surgeon  Anesthesia Plan Comments: (Plan routine monitors, GA with LMA, adductor canal and popliteal blocks for post op analgesia  PAT note written 09/26/2022 by Shonna Chock, PA-C.  )       Anesthesia Quick Evaluation

## 2022-09-26 NOTE — Progress Notes (Signed)
Office Visit Note   Patient: Kenneth Zimmerman.           Date of Birth: 10-Oct-1950           MRN: [id number] Visit Date: 09/26/2022              Requested by: Vickki Hearing, MD [address] [place],  [place] [address] PCP: [name], DO  Chief Complaint  Patient presents with   Left Foot - Injury   Right Foot - Injury      HPI: Patient is a 72 year old gentleman who is seen for initial evaluation for traumatic tear anterior tibial tendon left foot.  Patient states that about 2 weeks ago he was walking up the stairs when he caught the dorsum of his foot on the stair.  Patient had active contraction of the anterior tibial tendon and a eccentric stress causing acute rupture.  Patient states he did have some tendinitis prior to the rupture.  Assessment & Plan: Visit Diagnoses:  1. Traumatic tear of left anterior tibialis tendon     Plan: Recommended proceeding with reconstruction of the anterior tibial tendon with insertion medial navicular.  Risk and benefits were discussed postoperative care was discussed.  Patient states he understands wished to proceed at this time.  Patient will resume his Xarelto postoperative day 1.  Follow-Up Instructions: Return in about 1 week (around 10/03/2022).   Ortho Exam  Patient is alert, oriented, no adenopathy, well-dressed, normal affect, normal respiratory effort. Examination patient has a palpable dorsalis pedis and posterior tibial pulse.  He does have varicose veins with venous insufficiency.  No venous ulcers.  Patient has a palpable defect of the anterior tibial tendon at the level of the ankle.  Review of the MRI scan shows avulsion of the anterior tibial tendon off the navicular medially.  Patient is has weakness with dorsiflexion of the ankle.  Hemoglobin A1c 6.0.  Imaging: No results found. No images are attached to the encounter.  Labs: Lab Results  Component Value Date   HGBA1C 6.0 (H) 08/03/2022   HGBA1C 5.6  01/28/2020   HGBA1C 5.2 11/21/2018   ESRSEDRATE 2 12/17/2006   ESRSEDRATE 3 10/29/2006   CRP 0.1 12/17/2006   CRP 0.1 10/29/2006   REPTSTATUS 11/13/2006 FINAL 10/31/2006   REPTSTATUS 11/13/2006 FINAL 10/31/2006   REPTSTATUS 11/13/2006 FINAL 10/31/2006   REPTSTATUS 12/17/2006 FINAL 10/31/2006   REPTSTATUS 11/27/2006 FINAL 10/31/2006   REPTSTATUS 11/13/2006 FINAL 10/31/2006   REPTSTATUS 11/27/2006 FINAL 10/31/2006   REPTSTATUS 12/17/2006 FINAL 10/31/2006   GRAMSTAIN  10/31/2006    RARE WBC PRESENT, PREDOMINANTLY PMN NO SQUAMOUS EPITHELIAL CELLS SEEN NO ORGANISMS SEEN   GRAMSTAIN  10/31/2006    RARE WBC PRESENT, PREDOMINANTLY PMN NO SQUAMOUS EPITHELIAL CELLS SEEN NO ORGANISMS SEEN   GRAMSTAIN  10/31/2006    NO WBC SEEN NO SQUAMOUS EPITHELIAL CELLS SEEN NO ORGANISMS SEEN   GRAMSTAIN  10/31/2006    NO WBC SEEN NO SQUAMOUS EPITHELIAL CELLS SEEN NO ORGANISMS SEEN   CULT NO ANAEROBES ISOLATED Note: NO NOCARDIA ISOLATED 10/31/2006   CULT NO GROWTH Note: NO NOCARDIA ISOLATED 10/31/2006   CULT NO ANAEROBES ISOLATED Note: NO NOCARDIA ISOLATED 10/31/2006   CULT NO ACID FAST BACILLI ISOLATED IN 6 WEEKS 10/31/2006   CULT No Fungi Isolated in 4 Weeks 10/31/2006   CULT NO GROWTH Note: NO NOCARDIA ISOLATED 10/31/2006   CULT No Fungi Isolated in 4 Weeks 10/31/2006   CULT NO ACID FAST BACILLI ISOLATED IN 6  WEEKS 10/31/2006     Lab Results  Component Value Date   ALBUMIN 4.3 08/03/2022   ALBUMIN 4.7 01/28/2020   ALBUMIN 4.4 11/14/2018    No results found for: "MG" No results found for: "VD25OH"  No results found for: "PREALBUMIN"    Latest Ref Rng & Units 08/03/2022   12:20 PM 01/28/2020    7:58 AM 11/14/2018    8:17 AM  CBC EXTENDED  WBC 3.4 - 10.8 x10E3/uL 6.1  5.4  5.9   RBC 4.14 - 5.80 x10E6/uL 5.88  5.89  5.74   Hemoglobin 13.0 - 17.7 g/dL 40.9  81.1  91.4   HCT 37.5 - 51.0 % 48.7  48.3  47.9   Platelets 150 - 450 x10E3/uL 207  214  207   NEUT# 1.4 - 7.0 x10E3/uL 3.8   3.4  3.8   Lymph# 0.7 - 3.1 x10E3/uL 1.7  1.5  1.5      There is no height or weight on file to calculate BMI.  Orders:  No orders of the defined types were placed in this encounter.  No orders of the defined types were placed in this encounter.    Procedures: No procedures performed  Clinical Data: No additional findings.  ROS:  All other systems negative, except as noted in the HPI. Review of Systems  Objective: Vital Signs: There were no vitals taken for this visit.  Specialty Comments:  No specialty comments available.  PMFS History: Patient Active Problem List   Diagnosis Date Noted   Factor V Leiden mutation (HCC) 09/19/2022   Obesity 09/19/2022   Acute bronchitis 09/19/2022   Hepatic steatosis 08/31/2022   Hyperlipidemia 08/31/2022   Prediabetes 08/31/2022   Annual physical exam 08/31/2022   TB lung, latent 10/15/2020   Dysphonia 11/15/2016   Vocal cord atrophy 11/15/2016   Heterozygous factor V Leiden mutation (HCC) 10/22/2012   Past Medical History:  Diagnosis Date   ADHD (attention deficit hyperactivity disorder)    Close exposure to COVID-19 virus 10/18/2021   Fatty liver    Hyperlipidemia    TB lung, latent 10/15/2020   Thrombophlebitis leg    left    Family History  Problem Relation Age of Onset   Cancer Other        colon   Diabetes Father    Diabetes Brother     Past Surgical History:  Procedure Laterality Date   GANGLION CYST EXCISION     HAND SURGERY     repair of laceration of right hand   INFECTED SKIN DEBRIDEMENT     VASECTOMY     Social History   Occupational History   Not on file  Tobacco Use   Smoking status: Never   Smokeless tobacco: Never  Vaping Use   Vaping status: Never Used  Substance and Sexual Activity   Alcohol use: Yes    Alcohol/week: 1.0 standard drink of alcohol    Types: 1 Glasses of wine per week   Drug use: Never   Sexual activity: Not on file

## 2022-09-26 NOTE — Progress Notes (Signed)
Anesthesia Chart Review: SAME DAY WORK-UP  Case: [id number] Date/Time: 09/27/22 1101   Procedure: LEFT ANTERIOR TIBIAL TENDON RECONSTRUCTION (Left)   Anesthesia type: Choice   Pre-op diagnosis: Anterior Tibial Tendon Rupture Left   [facility]: [facility] 04 / [facility]   Surgeons: Nadara Mustard, MD       DISCUSSION: Patient is a 72 year old male scheduled for the above procedure.   History includes never smoker, HLD, fatty liver, latent TB (2022), Factor V Leiden mutation, LLE thrombophlebitis (with thrombus in left GSV 10/21/12), ADHD.   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 He was treated with isoniazid for 9 months for + TB with 2 QuantiFERON gold tests. He was followed by Acey Lav, MD is ID.   He was evaluated by Dr. Lajoyce Corners on 09/26/22. Note indicates he will have Dr. Emelda Fear resume Xarelto on POD #1.  He is a same day work-up, so anesthesia team to evaluate on the day of surgery. Labs and EKG on arrival as indicated. He had a coronary calcium score on 09/18/22 per PCP Dr. Adriana Simas that was 278 (60th percentile for age-,race-, and sex-matched controls). Test was ordered for family history of CAD. He is on Crestor 5 mg daily.    VS:  BP Readings from Last 3 Encounters:  09/19/22 133/80  08/31/22 124/81  11/19/20 126/78   Pulse Readings from Last 3 Encounters:  09/19/22 85  08/31/22 64  11/19/20 (!) 53     PROVIDERS: [name], DO is PCP    LABS: Most recent lab results in [facility] include: Lab Results  Component Value Date   WBC 6.1 08/03/2022   HGB 15.6 08/03/2022   HCT 48.7 08/03/2022   PLT 207 08/03/2022   GLUCOSE 103 (H) 08/03/2022   CHOL 179 08/03/2022   TRIG 123 08/03/2022   HDL 40 08/03/2022   LDLCALC 117 (H) 08/03/2022   ALT 40 08/03/2022   AST 31 08/03/2022   NA 140 08/03/2022   K 4.4 08/03/2022   CL 104 08/03/2022   CREATININE 1.20 08/03/2022   BUN 15 08/03/2022   CO2 21 08/03/2022   HGBA1C  6.0 (H) 08/03/2022    IMAGES: MRI Left foot 09/20/22: IMPRESSION: 1. Complete tear of the tibialis anterior with the retracted tendon located at the level of the navicular. 2. Mild tendinosis of the peroneus longus with tenosynovitis. 3. Mild tendinosis of the Achilles tendon. 4. Plantar fasciitis.   EKG: For day of surgery as indicated.    CV: CT Cardiac Calcium Scoring 09/18/22: FINDINGS: Coronary Calcium Score: Left main: 0 Left anterior descending artery: 175 Left circumflex artery: 0 Right coronary artery: 103 Total: 278 Percentile: 60th Pericardium: Normal.  IMPRESSION: 1. Coronary calcium score of 278. This was 60th percentile for age-, race-, and sex-matched controls. Marland KitchenMarland KitchenMarland KitchenIf CAC is >=100 or >=75th percentile, it is reasonable to initiate statin therapy at any age.   Cardiology referral should be considered for patients with CAC scores >=400 or >=75th percentile...   Past Medical History:  Diagnosis Date   ADHD (attention deficit hyperactivity disorder)    Close exposure to COVID-19 virus 10/18/2021   Fatty liver    Hyperlipidemia    TB lung, latent 10/15/2020   Thrombophlebitis leg    left    Past Surgical History:  Procedure Laterality Date   GANGLION CYST EXCISION     HAND SURGERY     repair of laceration  of right hand   INFECTED SKIN DEBRIDEMENT     VASECTOMY      MEDICATIONS: No current facility-administered medications for this encounter.    escitalopram (LEXAPRO) 10 MG tablet   ketoconazole (NIZORAL) 2 % cream   naproxen sodium (ALEVE) 220 MG tablet   rivaroxaban (XARELTO) 20 MG TABS tablet   rosuvastatin (CRESTOR) 5 MG tablet   sildenafil (REVATIO) 20 MG tablet   doxycycline (VIBRA-TABS) 100 MG tablet    Shonna Chock, PA-C Surgical Short Stay/Anesthesiology [facility] Phone [phone] [facility] Phone [phone] 09/26/2022 4:55 PM

## 2022-09-26 NOTE — Progress Notes (Signed)
PCP - Everlene Other Cardiologist - Denies  PPM/ICD - denies Device Orders - n/a Rep Notified - n/a    CPAP - Denies  DM- denies  Blood Thinner Instructions: Xarelto last dose 09-25-22. To restart after procedure post op day one Aspirin Instructions: n/a  ERAS Protcol - clear liquids until 8:20 A.M.  COVID TEST- n/a  Anesthesia review: yes HX of DVT   Patient verbally denies any shortness of breath, fever, cough and chest pain during phone call   -------------  SDW INSTRUCTIONS given:  Your procedure is scheduled on Sept 25, 2024.  Report to [facility] Main Entrance "A" at 8:50 A.M., and check in at the Admitting office.  Call this number if you have problems the morning of surgery:  [phone]   Remember:  Do not eat after midnight the night before your surgery  You may drink clear liquids until 8:20 a.m. the morning of your surgery.   Clear liquids allowed are: Water, Non-Citrus Juices (without pulp), Carbonated Beverages, Clear Tea, Black Coffee Only, and Gatorade    Take these medicines the morning of surgery with A SIP OF WATER  escitalopram (LEXAPRO)  rosuvastatin (CRESTOR)    IF NEEDED    As of today, STOP taking any Aspirin (unless otherwise instructed by your surgeon) Aleve, Naproxen, Ibuprofen, Motrin, Advil, Goody's, BC's, all herbal medications, fish oil, and all vitamins.                      Do not wear jewelry, make up, or nail polish            Do not wear lotions, powders, perfumes/colognes, or deodorant.            Do not shave 48 hours prior to surgery.  Men may shave face and neck.            Do not bring valuables to the hospital.            [facility] is not responsible for any belongings or valuables.  Do NOT Smoke (Tobacco/Vaping) 24 hours prior to your procedure If you use a CPAP at night, you may bring all equipment for your overnight stay.   Contacts, glasses, dentures or bridgework may not be worn into surgery.      For patients  admitted to the hospital, discharge time will be determined by your treatment team.   Patients discharged the day of surgery will not be allowed to drive home, and someone needs to stay with them for 24 hours.    Special instructions:   [place]- Preparing For Surgery  Before surgery, you can play an important role. Because skin is not sterile, your skin needs to be as free of germs as possible. You can reduce the number of germs on your skin by washing with CHG (chlorahexidine gluconate) Soap before surgery.  CHG is an antiseptic cleaner which kills germs and bonds with the skin to continue killing germs even after washing.    Oral Hygiene is also important to reduce your risk of infection.  Remember - BRUSH YOUR TEETH THE MORNING OF SURGERY WITH YOUR REGULAR TOOTHPASTE  Please do not use if you have an allergy to CHG or antibacterial soaps. If your skin becomes reddened/irritated stop using the CHG.  Do not shave (including legs and underarms) for at least 48 hours prior to first CHG shower. It is OK to shave your face.  Please follow these instructions carefully.   Shower  the NIGHT BEFORE SURGERY and the MORNING OF SURGERY with DIAL Soap.   Pat yourself dry with a CLEAN TOWEL.  Wear CLEAN PAJAMAS to bed the night before surgery  Place CLEAN SHEETS on your bed the night of your first shower and DO NOT SLEEP WITH PETS.   Day of Surgery: Please shower morning of surgery  Wear Clean/Comfortable clothing the morning of surgery Do not apply any deodorants/lotions.   Remember to brush your teeth WITH YOUR REGULAR TOOTHPASTE.   Questions were answered. Patient verbalized understanding of instructions.

## 2022-09-27 ENCOUNTER — Ambulatory Visit (HOSPITAL_COMMUNITY)
Admission: RE | Admit: 2022-09-27 | Discharge: 2022-09-27 | Disposition: A | Discharge status: Home / Self Care | Payer: Medicare Other | Source: Ambulatory Visit | Attending: Orthopedic Surgery | Admitting: Orthopedic Surgery

## 2022-09-27 ENCOUNTER — Ambulatory Visit (HOSPITAL_COMMUNITY): Payer: Self-pay | Admitting: Vascular Surgery

## 2022-09-27 ENCOUNTER — Ambulatory Visit (HOSPITAL_COMMUNITY): Payer: Medicare Other

## 2022-09-27 ENCOUNTER — Encounter (HOSPITAL_COMMUNITY)
Admission: RE | Disposition: A | Discharge status: Home / Self Care | Payer: Self-pay | Source: Ambulatory Visit | Attending: Orthopedic Surgery

## 2022-09-27 ENCOUNTER — Other Ambulatory Visit: Payer: Self-pay

## 2022-09-27 DIAGNOSIS — S96812A Strain of other specified muscles and tendons at ankle and foot level, left foot, initial encounter: Secondary | ICD-10-CM | POA: Diagnosis not present

## 2022-09-27 DIAGNOSIS — S86212A Strain of muscle(s) and tendon(s) of anterior muscle group at lower leg level, left leg, initial encounter: Secondary | ICD-10-CM

## 2022-09-27 DIAGNOSIS — S96819A Strain of other specified muscles and tendons at ankle and foot level, unspecified foot, initial encounter: Secondary | ICD-10-CM

## 2022-09-27 DIAGNOSIS — Z8611 Personal history of tuberculosis: Secondary | ICD-10-CM | POA: Diagnosis not present

## 2022-09-27 DIAGNOSIS — W2209XA Striking against other stationary object, initial encounter: Secondary | ICD-10-CM | POA: Insufficient documentation

## 2022-09-27 DIAGNOSIS — Z7901 Long term (current) use of anticoagulants: Secondary | ICD-10-CM | POA: Diagnosis not present

## 2022-09-27 DIAGNOSIS — Z86718 Personal history of other venous thrombosis and embolism: Secondary | ICD-10-CM | POA: Insufficient documentation

## 2022-09-27 DIAGNOSIS — Z79899 Other long term (current) drug therapy: Secondary | ICD-10-CM | POA: Diagnosis not present

## 2022-09-27 DIAGNOSIS — D6851 Activated protein C resistance: Secondary | ICD-10-CM | POA: Insufficient documentation

## 2022-09-27 DIAGNOSIS — M6702 Short Achilles tendon (acquired), left ankle: Secondary | ICD-10-CM | POA: Diagnosis not present

## 2022-09-27 HISTORY — PX: TENDON REPAIR: SHX5111

## 2022-09-27 LAB — CBC
HCT: 46 % (ref 39.0–52.0)
Hemoglobin: 15.2 g/dL (ref 13.0–17.0)
MCH: 27.3 pg (ref 26.0–34.0)
MCHC: 33 g/dL (ref 30.0–36.0)
MCV: 82.7 fL (ref 80.0–100.0)
Platelets: 170 10*3/uL (ref 150–400)
RBC: 5.56 MIL/uL (ref 4.22–5.81)
RDW: 13.7 % (ref 11.5–15.5)
WBC: 6.8 10*3/uL (ref 4.0–10.5)
nRBC: 0 % (ref 0.0–0.2)

## 2022-09-27 SURGERY — TENDON REPAIR
Anesthesia: General | Site: Ankle | Laterality: Left

## 2022-09-27 MED ORDER — DEXAMETHASONE SODIUM PHOSPHATE 10 MG/ML IJ SOLN
INTRAMUSCULAR | Status: DC | PRN
  Administered 2022-09-27: 10 mg via INTRAVENOUS

## 2022-09-27 MED ORDER — OXYCODONE HCL 5 MG PO TABS: 5.0000 mg | ORAL_TABLET | Freq: Once | ORAL | Status: DC | PRN

## 2022-09-27 MED ORDER — PROPOFOL 10 MG/ML IV BOLUS
INTRAVENOUS | Status: AC
  Filled 2022-09-27: qty 20

## 2022-09-27 MED ORDER — ONDANSETRON HCL 4 MG/2ML IJ SOLN
INTRAMUSCULAR | Status: AC
  Filled 2022-09-27: qty 2

## 2022-09-27 MED ORDER — CEFAZOLIN IN SODIUM CHLORIDE 3-0.9 GM/100ML-% IV SOLN
3.0000 g | INTRAVENOUS | Status: AC
  Administered 2022-09-27: 3 g via INTRAVENOUS

## 2022-09-27 MED ORDER — PHENYLEPHRINE 80 MCG/ML (10ML) SYRINGE FOR IV PUSH (FOR BLOOD PRESSURE SUPPORT)
PREFILLED_SYRINGE | INTRAVENOUS | Status: DC | PRN
  Administered 2022-09-27 (×3): 80 ug via INTRAVENOUS

## 2022-09-27 MED ORDER — DEXAMETHASONE SODIUM PHOSPHATE 10 MG/ML IJ SOLN
INTRAMUSCULAR | Status: AC
  Filled 2022-09-27: qty 1

## 2022-09-27 MED ORDER — OXYCODONE HCL 5 MG/5ML PO SOLN: 5.0000 mg | Freq: Once | ORAL | Status: DC | PRN

## 2022-09-27 MED ORDER — LACTATED RINGERS IV SOLN: INTRAVENOUS | Status: DC

## 2022-09-27 MED ORDER — PROPOFOL 10 MG/ML IV BOLUS
INTRAVENOUS | Status: DC | PRN
Start: 2022-09-27 — End: 2022-09-27
  Administered 2022-09-27: 80 mg via INTRAVENOUS
  Administered 2022-09-27: 200 mg via INTRAVENOUS

## 2022-09-27 MED ORDER — OXYCODONE-ACETAMINOPHEN 5-325 MG PO TABS
1.0000 | ORAL_TABLET | ORAL | 0 refills | Status: DC | PRN
Start: 2022-09-27 — End: 2023-04-13

## 2022-09-27 MED ORDER — MEPERIDINE HCL 25 MG/ML IJ SOLN: 6.2500 mg | INTRAMUSCULAR | Status: DC | PRN

## 2022-09-27 MED ORDER — FENTANYL CITRATE (PF) 100 MCG/2ML IJ SOLN
INTRAMUSCULAR | Status: AC
  Administered 2022-09-27: 50 ug via INTRAVENOUS
  Filled 2022-09-27: qty 2

## 2022-09-27 MED ORDER — HYDROMORPHONE HCL 1 MG/ML IJ SOLN: 0.2500 mg | INTRAMUSCULAR | Status: DC | PRN

## 2022-09-27 MED ORDER — ACETAMINOPHEN 500 MG PO TABS
1000.0000 mg | ORAL_TABLET | Freq: Once | ORAL | Status: AC
  Administered 2022-09-27: 1000 mg via ORAL
  Filled 2022-09-27: qty 2

## 2022-09-27 MED ORDER — CHLORHEXIDINE GLUCONATE 0.12 % MT SOLN: 15.0000 mL | Freq: Once | OROMUCOSAL | Status: AC

## 2022-09-27 MED ORDER — PHENYLEPHRINE HCL-NACL 20-0.9 MG/250ML-% IV SOLN
INTRAVENOUS | Status: DC | PRN
  Administered 2022-09-27: 40 ug/min via INTRAVENOUS

## 2022-09-27 MED ORDER — HYDROMORPHONE HCL 1 MG/ML IJ SOLN
INTRAMUSCULAR | Status: AC
  Filled 2022-09-27: qty 0.5

## 2022-09-27 MED ORDER — CHLORHEXIDINE GLUCONATE 0.12 % MT SOLN
OROMUCOSAL | Status: AC
  Administered 2022-09-27: 15 mL via OROMUCOSAL
  Filled 2022-09-27: qty 15

## 2022-09-27 MED ORDER — MIDAZOLAM HCL 2 MG/2ML IJ SOLN: 0.5000 mg | Freq: Once | INTRAMUSCULAR | Status: DC | PRN

## 2022-09-27 MED ORDER — BUPIVACAINE-EPINEPHRINE (PF) 0.5% -1:200000 IJ SOLN
INTRAMUSCULAR | Status: DC | PRN
Start: 2022-09-27 — End: 2022-09-27
  Administered 2022-09-27: 15 mL via PERINEURAL
  Administered 2022-09-27: 30 mL via PERINEURAL

## 2022-09-27 MED ORDER — EPHEDRINE 5 MG/ML INJ
INTRAVENOUS | Status: AC
  Filled 2022-09-27: qty 5

## 2022-09-27 MED ORDER — 0.9 % SODIUM CHLORIDE (POUR BTL) OPTIME
TOPICAL | Status: DC | PRN
Start: 2022-09-27 — End: 2022-09-27
  Administered 2022-09-27: 1000 mL

## 2022-09-27 MED ORDER — ORAL CARE MOUTH RINSE: 15.0000 mL | Freq: Once | OROMUCOSAL | Status: AC

## 2022-09-27 MED ORDER — MIDAZOLAM HCL 2 MG/2ML IJ SOLN
INTRAMUSCULAR | Status: AC
  Administered 2022-09-27: 1 mg via INTRAVENOUS
  Filled 2022-09-27: qty 2

## 2022-09-27 MED ORDER — PROMETHAZINE HCL 25 MG/ML IJ SOLN: 6.2500 mg | INTRAMUSCULAR | Status: DC | PRN

## 2022-09-27 MED ORDER — MIDAZOLAM HCL 2 MG/2ML IJ SOLN: 1.0000 mg | Freq: Once | INTRAMUSCULAR | Status: AC

## 2022-09-27 MED ORDER — HYDROMORPHONE HCL 1 MG/ML IJ SOLN
INTRAMUSCULAR | Status: DC | PRN
Start: 2022-09-27 — End: 2022-09-27
  Administered 2022-09-27: .5 mg via INTRAVENOUS

## 2022-09-27 MED ORDER — ONDANSETRON HCL 4 MG/2ML IJ SOLN
INTRAMUSCULAR | Status: DC | PRN
  Administered 2022-09-27: 4 mg via INTRAVENOUS

## 2022-09-27 MED ORDER — LIDOCAINE 2% (20 MG/ML) 5 ML SYRINGE
INTRAMUSCULAR | Status: DC | PRN
  Administered 2022-09-27: 40 mg via INTRAVENOUS

## 2022-09-27 MED ORDER — CEFAZOLIN IN SODIUM CHLORIDE 3-0.9 GM/100ML-% IV SOLN
INTRAVENOUS | Status: AC
  Filled 2022-09-27: qty 100

## 2022-09-27 MED ORDER — LIDOCAINE 2% (20 MG/ML) 5 ML SYRINGE
INTRAMUSCULAR | Status: AC
  Filled 2022-09-27: qty 5

## 2022-09-27 MED ORDER — FENTANYL CITRATE (PF) 100 MCG/2ML IJ SOLN: 50.0000 ug | Freq: Once | INTRAMUSCULAR | Status: AC

## 2022-09-27 SURGICAL SUPPLY — 49 items
ANCH SUT 1 SHRT SM RGD INSRTR (Anchor) ×1 IMPLANT
ANCHOR SUT 1.45 SZ 1 SHORT (Anchor) IMPLANT
BAG COUNTER SPONGE SURGICOUNT (BAG) ×2 IMPLANT
BAG SPNG CNTER NS LX DISP (BAG) ×1
BNDG CMPR 9X4 STRL LF SNTH (GAUZE/BANDAGES/DRESSINGS)
BNDG COHESIVE 6X5 TAN NS LF (GAUZE/BANDAGES/DRESSINGS) IMPLANT
BNDG ESMARK 4X9 LF (GAUZE/BANDAGES/DRESSINGS) ×2 IMPLANT
BNDG GAUZE DERMACEA FLUFF 4 (GAUZE/BANDAGES/DRESSINGS) IMPLANT
BNDG GZE DERMACEA 4 6PLY (GAUZE/BANDAGES/DRESSINGS) ×1
CORD BIPOLAR FORCEPS 12FT (ELECTRODE) ×2 IMPLANT
COVER SURGICAL LIGHT HANDLE (MISCELLANEOUS) ×4 IMPLANT
CUFF TOURN SGL QUICK 18X4 (TOURNIQUET CUFF) ×2 IMPLANT
CUFF TOURN SGL QUICK 24 (TOURNIQUET CUFF)
CUFF TRNQT CYL 24X4X16.5-23 (TOURNIQUET CUFF) IMPLANT
DRAPE OEC MINIVIEW 54X84 (DRAPES) IMPLANT
DRAPE U-SHAPE 47X51 STRL (DRAPES) ×2 IMPLANT
DRSG ADAPTIC 3X8 NADH LF (GAUZE/BANDAGES/DRESSINGS) IMPLANT
DURAPREP 26ML APPLICATOR (WOUND CARE) ×2 IMPLANT
ELECT REM PT RETURN 9FT ADLT (ELECTROSURGICAL) ×1
ELECTRODE REM PT RTRN 9FT ADLT (ELECTROSURGICAL) ×2 IMPLANT
GAUZE PAD ABD 8X10 STRL (GAUZE/BANDAGES/DRESSINGS) IMPLANT
GAUZE SPONGE 4X4 12PLY STRL (GAUZE/BANDAGES/DRESSINGS) IMPLANT
GLOVE BIOGEL PI IND STRL 9 (GLOVE) ×2 IMPLANT
GLOVE SURG ORTHO 9.0 STRL STRW (GLOVE) ×2 IMPLANT
GOWN STRL REUS W/ TWL XL LVL3 (GOWN DISPOSABLE) ×4 IMPLANT
GOWN STRL REUS W/TWL XL LVL3 (GOWN DISPOSABLE) ×2
GRAFT SKIN WND MICRO 38 (Tissue) IMPLANT
KIT BASIN OR (CUSTOM PROCEDURE TRAY) ×2 IMPLANT
KIT TURNOVER KIT B (KITS) ×2 IMPLANT
NDL 1/2 CIR CATGUT .05X1.09 (NEEDLE) IMPLANT
NDL HYPO 25GX1X1/2 BEV (NEEDLE) IMPLANT
NEEDLE 1/2 CIR CATGUT .05X1.09 (NEEDLE) ×1 IMPLANT
NEEDLE HYPO 25GX1X1/2 BEV (NEEDLE) IMPLANT
NS IRRIG 1000ML POUR BTL (IV SOLUTION) ×2 IMPLANT
PACK ORTHO EXTREMITY (CUSTOM PROCEDURE TRAY) ×2 IMPLANT
PAD ARMBOARD 7.5X6 YLW CONV (MISCELLANEOUS) ×4 IMPLANT
PAD CAST 4YDX4 CTTN HI CHSV (CAST SUPPLIES) IMPLANT
PADDING CAST COTTON 4X4 STRL (CAST SUPPLIES)
PADDING CAST COTTON 6X4 STRL (CAST SUPPLIES) IMPLANT
SPLINT FIBERGLASS 4X30 (CAST SUPPLIES) IMPLANT
SUT ETHILON 2 0 PSLX (SUTURE) IMPLANT
SUT FIBERWIRE #2 38 T-5 BLUE (SUTURE) ×2
SUT VIC AB 2-0 FS1 27 (SUTURE) IMPLANT
SUTURE FIBERWR #2 38 T-5 BLUE (SUTURE) IMPLANT
SYR CONTROL 10ML LL (SYRINGE) IMPLANT
TOWEL GREEN STERILE (TOWEL DISPOSABLE) ×2 IMPLANT
TOWEL GREEN STERILE FF (TOWEL DISPOSABLE) ×2 IMPLANT
TUBE CONNECTING 12X1/4 (SUCTIONS) IMPLANT
WATER STERILE IRR 1000ML POUR (IV SOLUTION) ×2 IMPLANT

## 2022-09-27 NOTE — Interval H&P Note (Signed)
History and Physical Interval Note:  09/27/2022 9:25 AM  Kenneth Bach V.  has presented today for surgery, with the diagnosis of Anterior Tibial Tendon Rupture Left.  The various methods of treatment have been discussed with the patient and family. After consideration of risks, benefits and other options for treatment, the patient has consented to  Procedure(s): LEFT ANTERIOR TIBIAL TENDON RECONSTRUCTION (Left) as a surgical intervention.  The patient's history has been reviewed, patient examined, no change in status, stable for surgery.  I have reviewed the patient's chart and labs.  Questions were answered to the patient's satisfaction.     Kenneth Zimmerman

## 2022-09-27 NOTE — Transfer of Care (Signed)
Immediate Anesthesia Transfer of Care Note  Patient: Kenneth Zimmerman  Procedure(s) Performed: LEFT ANTERIOR TIBIAL TENDON RECONSTRUCTION (Left: Ankle)  Patient Location: PACU  Anesthesia Type:General  Level of Consciousness: awake, alert , oriented, patient cooperative, and responds to stimulation  Airway & Oxygen Therapy: Patient Spontanous Breathing and Patient connected to face mask oxygen  Post-op Assessment: Report given to RN and Post -op Vital signs reviewed and stable  Post vital signs: Reviewed and stable  Last Vitals:  Vitals Value Taken Time  BP 110/62 09/27/22 1337  Temp 97.97F   Pulse 64 09/27/22 1341  Resp 16 09/27/22 1341  SpO2 94 % 09/27/22 1341  Vitals shown include unfiled device data.       Complications: No notable events documented.

## 2022-09-27 NOTE — Anesthesia Postprocedure Evaluation (Signed)
Anesthesia Post Note  Patient: Kenneth Zimmerman  Procedure(s) Performed: LEFT ANTERIOR TIBIAL TENDON RECONSTRUCTION (Left: Ankle)     Patient location during evaluation: PACU Anesthesia Type: General Level of consciousness: awake and alert, patient cooperative and oriented Pain management: pain level controlled Vital Signs Assessment: post-procedure vital signs reviewed and stable Respiratory status: spontaneous breathing, nonlabored ventilation and respiratory function stable Cardiovascular status: blood pressure returned to baseline and stable Postop Assessment: no apparent nausea or vomiting and adequate PO intake Anesthetic complications: no   No notable events documented.  Last Vitals:  Vitals:   09/27/22 1400 09/27/22 1415  BP: 116/74 (!) 119/90  Pulse: 60 (!) 55  Resp: 13 11  Temp:  36.4 C  SpO2: 93% 95%    Last Pain:  Vitals:   09/27/22 1415  TempSrc:   PainSc: 0-No pain                 Symphony Demuro,E. Shaelee Forni

## 2022-09-27 NOTE — Anesthesia Procedure Notes (Signed)
Anesthesia Regional Block: Popliteal block   Pre-Anesthetic Checklist: , timeout performed,  Correct Patient, Correct Site, Correct Laterality,  Correct Procedure, Correct Position, site marked,  Risks and benefits discussed,  Surgical consent,  Pre-op evaluation,  At surgeon's request and post-op pain management  Laterality: Left and Lower  Prep: chloraprep       Needles:  Injection technique: Single-shot  Needle Type: Echogenic Needle     Needle Length: 9cm  Needle Gauge: 21     Additional Needles:   Procedures:,,,, ultrasound used (permanent image in chart),,    Narrative:  Start time: 09/27/2022 11:24 AM End time: 09/27/2022 11:31 AM Injection made incrementally with aspirations every 5 mL.  Performed by: Personally  Anesthesiologist: Jairo Ben, MD  Additional Notes: Pt identified in Holding room.  Monitors applied. Working IV access confirmed. Timeout, Sterile prep L distal lateral thigh.  #21ga ECHOgenic Arrow block needle to sciatic nerve at split in pop fossa with US guidance.  30cc 0.5% Bupivacaine 1:200k epi injected incrementally after negative test dose.  Patient asymptomatic, VSS, no heme aspirated, tolerated well.   Sandford Craze, MD

## 2022-09-27 NOTE — Op Note (Addendum)
09/27/2022  1:55 PM  PATIENT:  Kenneth Zimmerman.    PRE-OPERATIVE DIAGNOSIS:  Anterior Tibial Tendon Rupture Left  POST-OPERATIVE DIAGNOSIS: Anterior tibial tendon rupture and Achilles contracture on the left  PROCEDURE:  LEFT ANTERIOR TIBIAL TENDON RECONSTRUCTION Left gastrocnemius recession. Application of 38 cm Kerecis micro graft to reinforce the repair. C-arm fluoroscopy to verify the anchor site. SURGEON:  Nadara Mustard, MD  PHYSICIAN ASSISTANT:None ANESTHESIA:   General  PREOPERATIVE INDICATIONS:  Kenneth Zimmerman is a  72 y.o. male with a diagnosis of Anterior Tibial Tendon Rupture Left who failed conservative measures and elected for surgical management.    The risks benefits and alternatives were discussed with the patient preoperatively including but not limited to the risks of infection, bleeding, nerve injury, cardiopulmonary complications, the need for revision surgery, among others, and the patient was willing to proceed.  OPERATIVE IMPLANTS:   Implant Name Type Inv. Item Serial No. Manufacturer Lot No. LRB No. Used Action  ANCH SUT 1 SHRT SM RGD INSRTR - [id number] Anchor ANCH SUT 1 SHRT SM RGD INSRTR  [employer]MER [employer](ORTH,TRAU,BIO,SG) [id number] Left 1 Implanted  GRAFT SKIN WND MICRO 38 - [id number] Tissue GRAFT SKIN WND MICRO 38  [employer] [id number] Left 1 Implanted    @ENCIMAGES @  OPERATIVE FINDINGS: Patient had equinus contracture of the ankle with dorsiflexion short of neutral.  After gastrocnemius recession patient had dorsiflexion of 20 degrees.  OPERATIVE PROCEDURE: Patient was brought the operating room and underwent a general anesthetic.  After adequate levels anesthesia obtained patient's left lower extremity was prepped using DuraPrep draped into a sterile field a timeout was called.  An incision was made obliquely across the navicular at the insertion of the anterior tibial tendon.  Blunt dissection was carried down to bone.  The tendon was not  visualized.  Using a grasper and the tunnel I was unable to pull the tendon down.  A separate incision was then made over the prominence of the retracted anterior tibial tendon blunt dissection was carried down to the capsule and the anterior tibial tendon was delivered to the surgical field.  A suture passer was then placed distal to proximally.  The middle aspect of the navicular was debrided see en face be was used to verify the entry site for the anchor.  The drill for the anchor was placed and the anchor secured.  Using Krakw technique a #1 FiberWire was braided through the distal aspect of the anterior tibial tendon.  There is a significant amount of hematoma within the distal aspect.  Approximately 5 mm of the distal tibia was debrided back to healthy viable tendon.  The 2 strands of the #1 Vicryl exited mid substance of the distal anterior tibial tendon.  This was passed through the tunnel with the suture passer.  With the tendon brought down it was short of the insertion.  With the patient's Achilles contracture approximately 20 degrees short of neutral I was unable to get his ankle dorsiflex to bring the tendon down to the navicular.  Decision was made to perform a gastrocnemius recession.  An incision was made medially on the calf 15 cm proximal to the medial malleolus.  Blunt dissection was carried down to the gastrocnemius fascia and the gastrocnemius fascia was released under direct visualization.  There is throughout his dorsiflexion to 20 degrees past neutral from 20 degrees short of neutral.  With the ankle held in dorsiflexion the suture anchor FiberWire was secured to the Krakauer suture technique FiberWire.  This had a good secure fit.  Kerecis micro graft 38 cm was then used to reinforce the tendon repair and this was placed at the insertion as well as within the sheath.  The wounds were irrigated with normal saline and the incision closed using 2-0 nylon.  The ankle was held in dorsiflexion  and a sterile dressing was applied with Adaptic 4 x 4's ABD and Kerlix.  This was then overwrapped with posterior and sugar-tong splint with the ankle in dorsiflexion this was secured with Webril and Coban.  Once this was mature patient was extubated taken the PACU in stable condition.   DISCHARGE PLANNING:  Antibiotic duration: Preoperative antibiotics only  Weightbearing: Strict nonweightbearing on the left  Pain medication: Prescription for Percocet sent to [facility] in [place]  Dressing care/ Wound VAC: Dry dressing with a splint  Ambulatory devices: Crutches or kneeling scooter  Discharge to: Home.  Follow-up: In the office 1 week post operative.

## 2022-09-27 NOTE — H&P (Signed)
Kenneth Zimmerman is an 72 y.o. male.   Chief Complaint: Rupture anterior tibial tendon left foot HPI: Patient is a 72 year old gentleman who is seen for initial evaluation for traumatic tear anterior tibial tendon left foot. Patient states that about 2 weeks ago he was walking up the stairs when he caught the dorsum of his foot on the stair. Patient had active contraction of the anterior tibial tendon and a eccentric stress causing acute rupture. Patient states he did have some tendinitis prior to the rupture.   Past Medical History:  Diagnosis Date   ADHD (attention deficit hyperactivity disorder)    Close exposure to COVID-19 virus 10/18/2021   Fatty liver    Fatty liver    Hyperlipidemia    TB lung, latent 10/15/2020   Thrombophlebitis leg    left    Past Surgical History:  Procedure Laterality Date   GANGLION CYST EXCISION     HAND SURGERY     repair of laceration of right hand   INFECTED SKIN DEBRIDEMENT     right index finger   VASECTOMY      Family History  Problem Relation Age of Onset   Cancer Other        colon   Diabetes Father    Diabetes Brother    Social History:  reports that he has never smoked. He has never used smokeless tobacco. He reports current alcohol use of about 1.0 standard drink of alcohol per week. He reports that he does not use drugs.  Allergies: No Known Allergies  No medications prior to admission.    No results found for this or any previous visit (from the past 48 hour(s)). No results found.  Review of Systems  All other systems reviewed and are negative.   Height 6\' 4"  (1.93 m). Physical Exam  Patient is alert, oriented, no adenopathy, well-dressed, normal affect, normal respiratory effort. Examination patient has a palpable dorsalis pedis and posterior tibial pulse.  He does have varicose veins with venous insufficiency.  No venous ulcers.  Patient has a palpable defect of the anterior tibial tendon at the level of the ankle.   Review of the MRI scan shows avulsion of the anterior tibial tendon off the navicular medially.  Patient is has weakness with dorsiflexion of the ankle. Assessment/Plan 1. Traumatic tear of left anterior tibialis tendon       Plan: Recommended proceeding with reconstruction of the anterior tibial tendon with insertion medial navicular.  Risk and benefits were discussed postoperative care was discussed.  Patient states he understands wished to proceed at this time.  Patient will resume his Xarelto postoperative day 1.  Nadara Mustard, MD 09/27/2022, 6:53 AM

## 2022-09-27 NOTE — Anesthesia Procedure Notes (Signed)
Anesthesia Regional Block: Adductor canal block   Pre-Anesthetic Checklist: , timeout performed,  Correct Patient, Correct Site, Correct Laterality,  Correct Procedure, Correct Position, site marked,  Risks and benefits discussed,  Surgical consent,  Pre-op evaluation,  At surgeon's request and post-op pain management  Laterality: Left and Lower  Prep: chloraprep       Needles:  Injection technique: Single-shot  Needle Type: Echogenic Needle     Needle Length: 9cm  Needle Gauge: 21     Additional Needles:   Procedures:,,,, ultrasound used (permanent image in chart),,    Narrative:  Start time: 09/27/2022 11:18 AM End time: 09/27/2022 11:24 AM Injection made incrementally with aspirations every 5 mL.  Performed by: Personally  Anesthesiologist: Jairo Ben, MD  Additional Notes: Pt identified in Holding room.  Monitors applied. Working IV access confirmed. Timeout, Sterile prep L thigh.  #21ga ECHOgenic Arrow block needle into adductor canal with US guidance.  15cc 0.5% Bupivacaine 1:200k epi injected incrementally after negative test dose.  Patient asymptomatic, VSS, no heme aspirated, tolerated well.   Sandford Craze, MD

## 2022-09-27 NOTE — Anesthesia Procedure Notes (Signed)
Procedure Name: LMA Insertion Date/Time: 09/27/2022 12:24 PM  Performed by: Earlene Plater, CRNAPre-anesthesia Checklist: Patient identified, Emergency Drugs available, Suction available, Patient being monitored and Timeout performed Patient Re-evaluated:Patient Re-evaluated prior to induction Oxygen Delivery Method: Circle system utilized Preoxygenation: Pre-oxygenation with 100% oxygen Induction Type: IV induction Ventilation: Mask ventilation without difficulty LMA: LMA inserted LMA Size: 5.0 Number of attempts: 1 Placement Confirmation: ETT inserted through vocal cords under direct vision, positive ETCO2, breath sounds checked- equal and bilateral and CO2 detector Tube secured with: Tape Dental Injury: Teeth and Oropharynx as per pre-operative assessment  Comments: Confirmed by Dr. Jean Rosenthal

## 2022-09-28 ENCOUNTER — Encounter (HOSPITAL_COMMUNITY): Payer: Self-pay | Admitting: Orthopedic Surgery

## 2022-10-04 ENCOUNTER — Encounter: Payer: Medicare Other | Admitting: Family

## 2022-10-04 ENCOUNTER — Ambulatory Visit (INDEPENDENT_AMBULATORY_CARE_PROVIDER_SITE_OTHER): Payer: Medicare Other | Admitting: Family

## 2022-10-04 DIAGNOSIS — S86212A Strain of muscle(s) and tendon(s) of anterior muscle group at lower leg level, left leg, initial encounter: Secondary | ICD-10-CM

## 2022-10-04 NOTE — Progress Notes (Signed)
   Post-Op Visit Note   Patient: Kenneth Zimmerman.           Date of Birth: 1950-10-23           MRN: [id number] Visit Date: 10/04/2022 PCP: [name], DO  Chief Complaint:  Chief Complaint  Patient presents with   Left Ankle - Routine Post Op    09/27/22 left anterior tibial tendon reconstruction.    HPI:  HPI The patient is a 72 year old gentleman seen status post left anterior tibial tendon reconstruction September 25.  He has been doing dry dressing changes daily. Ortho Exam On examination of the left foot and ankle incisions are well-approximated there is no gaping or drainage no surrounding erythema no cellulitis  moderate edema  Visit Diagnoses: No diagnosis found.  Plan: Continue daily Dial soap cleansing.  Dry dressings.  Continue CAM Walker and nonweightbearing.  Follow-Up Instructions: Return in about 2 weeks (around 10/18/2022).   Imaging: No results found.  Orders:  No orders of the defined types were placed in this encounter.  No orders of the defined types were placed in this encounter.    PMFS History: Patient Active Problem List   Diagnosis Date Noted   Rupture of extensor tendon of foot 09/27/2022   Factor V Leiden mutation (HCC) 09/19/2022   Obesity 09/19/2022   Acute bronchitis 09/19/2022   Hepatic steatosis 08/31/2022   Hyperlipidemia 08/31/2022   Prediabetes 08/31/2022   Annual physical exam 08/31/2022   TB lung, latent 10/15/2020   Dysphonia 11/15/2016   Vocal cord atrophy 11/15/2016   Heterozygous factor V Leiden mutation (HCC) 10/22/2012   Past Medical History:  Diagnosis Date   ADHD (attention deficit hyperactivity disorder)    Close exposure to COVID-19 virus 10/18/2021   Fatty liver    Fatty liver    Hyperlipidemia    TB lung, latent 10/15/2020   Thrombophlebitis leg    left    Family History  Problem Relation Age of Onset   Cancer Other        colon   Diabetes Father    Diabetes Brother     Past Surgical History:   Procedure Laterality Date   GANGLION CYST EXCISION     HAND SURGERY     repair of laceration of right hand   INFECTED SKIN DEBRIDEMENT     right index finger   TENDON REPAIR Left 09/27/2022   Procedure: LEFT ANTERIOR TIBIAL TENDON RECONSTRUCTION;  Surgeon: Nadara Mustard, MD;  Location: [facility] OR;  Service: Orthopedics;  Laterality: Left;   VASECTOMY     Social History   Occupational History   Not on file  Tobacco Use   Smoking status: Never   Smokeless tobacco: Never  Vaping Use   Vaping status: Never Used  Substance and Sexual Activity   Alcohol use: Yes    Alcohol/week: 1.0 standard drink of alcohol    Types: 1 Glasses of wine per week   Drug use: Never   Sexual activity: Not on file

## 2022-10-06 ENCOUNTER — Other Ambulatory Visit: Payer: Self-pay | Admitting: Family Medicine

## 2022-10-06 DIAGNOSIS — Z1212 Encounter for screening for malignant neoplasm of rectum: Secondary | ICD-10-CM

## 2022-10-06 DIAGNOSIS — Z1211 Encounter for screening for malignant neoplasm of colon: Secondary | ICD-10-CM

## 2022-10-11 ENCOUNTER — Encounter: Payer: Self-pay | Admitting: Family

## 2022-10-13 ENCOUNTER — Ambulatory Visit: Payer: Medicare Other | Admitting: Family

## 2022-10-13 ENCOUNTER — Encounter: Payer: Self-pay | Admitting: Family

## 2022-10-13 DIAGNOSIS — S96819A Strain of other specified muscles and tendons at ankle and foot level, unspecified foot, initial encounter: Secondary | ICD-10-CM

## 2022-10-13 NOTE — Progress Notes (Signed)
   Post-Op Visit Note   Patient: Kenneth Zimmerman.           Date of Birth: 1950/02/17           MRN: [id number] Visit Date: 10/13/2022 PCP: [name], DO  Chief Complaint:  Chief Complaint  Patient presents with   Left Ankle - Routine Post Op    09/27/22 left anterior tibial tendon reconstruction.    HPI:  HPI The patient is a 72 year old gentleman seen status post left anterior tibial tendon reconstruction in a cam walker.  Has been nonweightbearing  No concerns voiced  Ortho Exam Incisions healing well sutures are in place these will be harvested today there is no gaping no drainage no erythema.  Edema well-controlled. Visit Diagnoses: No diagnosis found.  Plan: May begin range of motion of the ankle dorsiflexion.  He will continue the cam walker.  May begin touchdown weightbearing in the cam boot.    Follow-Up Instructions: Return in about 2 weeks (around 10/27/2022).   Imaging: No results found.  Orders:  No orders of the defined types were placed in this encounter.  No orders of the defined types were placed in this encounter.    PMFS History: Patient Active Problem List   Diagnosis Date Noted   Rupture of extensor tendon of foot 09/27/2022   Factor V Leiden mutation (HCC) 09/19/2022   Obesity 09/19/2022   Acute bronchitis 09/19/2022   Hepatic steatosis 08/31/2022   Hyperlipidemia 08/31/2022   Prediabetes 08/31/2022   Annual physical exam 08/31/2022   TB lung, latent 10/15/2020   Dysphonia 11/15/2016   Vocal cord atrophy 11/15/2016   Heterozygous factor V Leiden mutation (HCC) 10/22/2012   Past Medical History:  Diagnosis Date   ADHD (attention deficit hyperactivity disorder)    Close exposure to COVID-19 virus 10/18/2021   Fatty liver    Fatty liver    Hyperlipidemia    TB lung, latent 10/15/2020   Thrombophlebitis leg    left    Family History  Problem Relation Age of Onset   Cancer Other        colon   Diabetes Father    Diabetes Brother      Past Surgical History:  Procedure Laterality Date   GANGLION CYST EXCISION     HAND SURGERY     repair of laceration of right hand   INFECTED SKIN DEBRIDEMENT     right index finger   TENDON REPAIR Left 09/27/2022   Procedure: LEFT ANTERIOR TIBIAL TENDON RECONSTRUCTION;  Surgeon: Nadara Mustard, MD;  Location: [facility] OR;  Service: Orthopedics;  Laterality: Left;   VASECTOMY     Social History   Occupational History   Not on file  Tobacco Use   Smoking status: Never   Smokeless tobacco: Never  Vaping Use   Vaping status: Never Used  Substance and Sexual Activity   Alcohol use: Yes    Alcohol/week: 1.0 standard drink of alcohol    Types: 1 Glasses of wine per week   Drug use: Never   Sexual activity: Not on file

## 2022-10-20 ENCOUNTER — Encounter: Payer: Medicare Other | Admitting: Family

## 2022-10-30 ENCOUNTER — Encounter: Payer: Self-pay | Admitting: Orthopedic Surgery

## 2022-10-31 ENCOUNTER — Ambulatory Visit (INDEPENDENT_AMBULATORY_CARE_PROVIDER_SITE_OTHER): Payer: Medicare Other | Admitting: Orthopedic Surgery

## 2022-10-31 ENCOUNTER — Encounter: Payer: Self-pay | Admitting: Family

## 2022-10-31 ENCOUNTER — Other Ambulatory Visit (HOSPITAL_COMMUNITY): Payer: Self-pay

## 2022-10-31 DIAGNOSIS — S96819A Strain of other specified muscles and tendons at ankle and foot level, unspecified foot, initial encounter: Secondary | ICD-10-CM

## 2022-10-31 MED ORDER — FLUCONAZOLE 150 MG PO TABS
150.0000 mg | ORAL_TABLET | ORAL | 0 refills | Status: DC
  Filled 2022-10-31: qty 3, 21d supply, fill #0

## 2022-10-31 NOTE — Progress Notes (Signed)
Office Visit Note   Patient: Kenneth Zimmerman.           Date of Birth: March 27, 1950           MRN: [id number] Visit Date: 10/31/2022              Requested by: [name], DO [address] [place],  [place] [address] PCP: [name], DO  Chief Complaint  Patient presents with   Left Ankle - Routine Post Op    09/27/22 left anterior tibial tendon reconstruction.      HPI: Patient is a 72 year old gentleman who is 4 weeks status post left anterior tibial tendon reconstruction.  Patient has been in a cam boot walker  Assessment & Plan: Visit Diagnoses:  1. Rupture of extensor tendon of foot     Plan: Patient is called in a prescription for Diflucan 150 mg weekly for 3 weeks.  Patient will continue with passive range of motion and continue with the cam boot walker.  Plan to start active range of motion at follow-up in 4 weeks.  Patient is given a Vive wear 2XL compression sock.  He will wear a sock 24 hours a day changing daily. Follow-Up Instructions: Return in about 4 weeks (around 11/28/2022).   Ortho Exam  Patient is alert, oriented, no adenopathy, well-dressed, normal affect, normal respiratory effort. Examination patient does have increased swelling in the left lower extremity the calf is 48 cm in circumference the right calf is 46 cm in circumference.  Patient has a biphasic dorsalis pedis and posterior tibial pulse no evidence of arterial insufficiency at this time.  There is a fungal rash over the incisions.  Imaging: No results found. No images are attached to the encounter.  Labs: Lab Results  Component Value Date   HGBA1C 6.0 (H) 08/03/2022   HGBA1C 5.6 01/28/2020   HGBA1C 5.2 11/21/2018   ESRSEDRATE 2 12/17/2006   ESRSEDRATE 3 10/29/2006   CRP 0.1 12/17/2006   CRP 0.1 10/29/2006   REPTSTATUS 11/13/2006 FINAL 10/31/2006   REPTSTATUS 11/13/2006 FINAL 10/31/2006   REPTSTATUS 11/13/2006 FINAL 10/31/2006   REPTSTATUS 12/17/2006 FINAL 10/31/2006    REPTSTATUS 11/27/2006 FINAL 10/31/2006   REPTSTATUS 11/13/2006 FINAL 10/31/2006   REPTSTATUS 11/27/2006 FINAL 10/31/2006   REPTSTATUS 12/17/2006 FINAL 10/31/2006   GRAMSTAIN  10/31/2006    RARE WBC PRESENT, PREDOMINANTLY PMN NO SQUAMOUS EPITHELIAL CELLS SEEN NO ORGANISMS SEEN   GRAMSTAIN  10/31/2006    RARE WBC PRESENT, PREDOMINANTLY PMN NO SQUAMOUS EPITHELIAL CELLS SEEN NO ORGANISMS SEEN   GRAMSTAIN  10/31/2006    NO WBC SEEN NO SQUAMOUS EPITHELIAL CELLS SEEN NO ORGANISMS SEEN   GRAMSTAIN  10/31/2006    NO WBC SEEN NO SQUAMOUS EPITHELIAL CELLS SEEN NO ORGANISMS SEEN   CULT NO ANAEROBES ISOLATED Note: NO NOCARDIA ISOLATED 10/31/2006   CULT NO GROWTH Note: NO NOCARDIA ISOLATED 10/31/2006   CULT NO ANAEROBES ISOLATED Note: NO NOCARDIA ISOLATED 10/31/2006   CULT NO ACID FAST BACILLI ISOLATED IN 6 WEEKS 10/31/2006   CULT No Fungi Isolated in 4 Weeks 10/31/2006   CULT NO GROWTH Note: NO NOCARDIA ISOLATED 10/31/2006   CULT No Fungi Isolated in 4 Weeks 10/31/2006   CULT NO ACID FAST BACILLI ISOLATED IN 6 WEEKS 10/31/2006     Lab Results  Component Value Date   ALBUMIN 4.3 08/03/2022   ALBUMIN 4.7 01/28/2020   ALBUMIN 4.4 11/14/2018    No results found for: "MG" No results found for: "VD25OH"  No results found for: "PREALBUMIN"    Latest Ref Rng & Units 09/27/2022    9:46 AM 08/03/2022   12:20 PM 01/28/2020    7:58 AM  CBC EXTENDED  WBC 4.0 - 10.5 K/uL 6.8  6.1  5.4   RBC 4.22 - 5.81 MIL/uL 5.56  5.88  5.89   Hemoglobin 13.0 - 17.0 g/dL 29.5  62.1  30.8   HCT 39.0 - 52.0 % 46.0  48.7  48.3   Platelets 150 - 400 K/uL 170  207  214   NEUT# 1.4 - 7.0 x10E3/uL  3.8  3.4   Lymph# 0.7 - 3.1 x10E3/uL  1.7  1.5      There is no height or weight on file to calculate BMI.  Orders:  No orders of the defined types were placed in this encounter.  Meds ordered this encounter  Medications   fluconazole (DIFLUCAN) 150 MG tablet    Sig: Take 1 tablet (150 mg total) by mouth  once a week.    Dispense:  3 tablet    Refill:  0     Procedures: No procedures performed  Clinical Data: No additional findings.  ROS:  All other systems negative, except as noted in the HPI. Review of Systems  Objective: Vital Signs: There were no vitals taken for this visit.  Specialty Comments:  No specialty comments available.  PMFS History: Patient Active Problem List   Diagnosis Date Noted   Rupture of extensor tendon of foot 09/27/2022   Factor V Leiden mutation (HCC) 09/19/2022   Obesity 09/19/2022   Acute bronchitis 09/19/2022   Hepatic steatosis 08/31/2022   Hyperlipidemia 08/31/2022   Prediabetes 08/31/2022   Annual physical exam 08/31/2022   TB lung, latent 10/15/2020   Dysphonia 11/15/2016   Vocal cord atrophy 11/15/2016   Heterozygous factor V Leiden mutation (HCC) 10/22/2012   Past Medical History:  Diagnosis Date   ADHD (attention deficit hyperactivity disorder)    Close exposure to COVID-19 virus 10/18/2021   Fatty liver    Fatty liver    Hyperlipidemia    TB lung, latent 10/15/2020   Thrombophlebitis leg    left    Family History  Problem Relation Age of Onset   Cancer Other        colon   Diabetes Father    Diabetes Brother     Past Surgical History:  Procedure Laterality Date   GANGLION CYST EXCISION     HAND SURGERY     repair of laceration of right hand   INFECTED SKIN DEBRIDEMENT     right index finger   TENDON REPAIR Left 09/27/2022   Procedure: LEFT ANTERIOR TIBIAL TENDON RECONSTRUCTION;  Surgeon: Nadara Mustard, MD;  Location: [facility];  Service: Orthopedics;  Laterality: Left;   VASECTOMY     Social History   Occupational History   Not on file  Tobacco Use   Smoking status: Never   Smokeless tobacco: Never  Vaping Use   Vaping status: Never Used  Substance and Sexual Activity   Alcohol use: Yes    Alcohol/week: 1.0 standard drink of alcohol    Types: 1 Glasses of wine per week   Drug use: Never   Sexual activity:  Not on file

## 2022-11-21 ENCOUNTER — Other Ambulatory Visit (HOSPITAL_COMMUNITY): Payer: Self-pay

## 2022-11-21 ENCOUNTER — Other Ambulatory Visit: Payer: Self-pay | Admitting: Orthopedic Surgery

## 2022-11-21 MED ORDER — FLUCONAZOLE 150 MG PO TABS
150.0000 mg | ORAL_TABLET | ORAL | 0 refills | Status: DC
  Filled 2022-11-21: qty 6, 42d supply, fill #0

## 2022-11-22 ENCOUNTER — Other Ambulatory Visit (HOSPITAL_COMMUNITY): Payer: Self-pay

## 2022-11-23 ENCOUNTER — Encounter: Payer: Medicare Other | Admitting: Family

## 2022-11-29 LAB — COLOGUARD: COLOGUARD: NEGATIVE

## 2022-12-05 ENCOUNTER — Ambulatory Visit (INDEPENDENT_AMBULATORY_CARE_PROVIDER_SITE_OTHER): Payer: Medicare Other | Admitting: Family

## 2022-12-05 ENCOUNTER — Encounter: Payer: Self-pay | Admitting: Family

## 2022-12-05 DIAGNOSIS — S96819A Strain of other specified muscles and tendons at ankle and foot level, unspecified foot, initial encounter: Secondary | ICD-10-CM

## 2022-12-05 DIAGNOSIS — M795 Residual foreign body in soft tissue: Secondary | ICD-10-CM

## 2022-12-05 MED ORDER — CEPHALEXIN 500 MG PO CAPS: 500.0000 mg | ORAL_CAPSULE | Freq: Three times a day (TID) | ORAL | 0 refills | Status: DC

## 2022-12-05 MED ORDER — FLUCONAZOLE 150 MG PO TABS: 150.0000 mg | ORAL_TABLET | ORAL | 0 refills | Status: DC

## 2022-12-05 NOTE — Progress Notes (Signed)
Post-Op Visit Note   Patient: Kenneth Zimmerman.           Date of Birth: [birth year]           MRN: [id number] Visit Date: 12/05/2022 PCP: [name], DO  Chief Complaint:  Chief Complaint  Patient presents with   Left Ankle - Routine Post Op    09/27/22 left anterior tibial tendon reconstruction.    HPI:  HPI The patient is a 72 year old gentleman seen status post left anterior tibial tendon reconstruction  Is pleased with healing of his skin resolution of erythema and rash.  He feels he is slowly getting his strength back.  Wondering what any weightbearing restrictions may be in the future  Also has a painful area to the posterior heel wonders if this is a foot ulcer has not had any drainage redness or injury Ortho Exam On examination of the left ankle his incisions are well-healed the fungal infection has resolved there is no erythema no dermatitis no edema  Has a 5 mm in diameter hematoma to the posterior heel this was debrided with a 10 blade knife after informed consent there is underlying 1 drop of purulence and exposed foreign body.  This was removed with pickups there was a 1 cm in length bit of thin wire there is no surrounding erythema noted odor.  Patient voiced relief.  Visit Diagnoses: No diagnosis found.  Plan: Will place on a course of Keflex.  He will advance his weightbearing as tolerated continue with PT home exercise program.  Follow-up as needed.  Follow-Up Instructions: No follow-ups on file.   Imaging: No results found.  Orders:  No orders of the defined types were placed in this encounter.  Meds ordered this encounter  Medications   fluconazole (DIFLUCAN) 150 MG tablet    Sig: Take 1 tablet (150 mg total) by mouth 3 (three) times a week.    Dispense:  30 tablet    Refill:  0   cephALEXin (KEFLEX) 500 MG capsule    Sig: Take 1 capsule (500 mg total) by mouth 3 (three) times daily.    Dispense:  30 capsule    Refill:  0     PMFS  History: Patient Active Problem List   Diagnosis Date Noted   Rupture of extensor tendon of foot 09/27/2022   Factor V Leiden mutation (HCC) 09/19/2022   Obesity 09/19/2022   Acute bronchitis 09/19/2022   Hepatic steatosis 08/31/2022   Hyperlipidemia 08/31/2022   Prediabetes 08/31/2022   Annual physical exam 08/31/2022   TB lung, latent 10/15/2020   Dysphonia 11/15/2016   Vocal cord atrophy 11/15/2016   Heterozygous factor V Leiden mutation (HCC) 10/22/2012   Past Medical History:  Diagnosis Date   ADHD (attention deficit hyperactivity disorder)    Close exposure to COVID-19 virus 10/18/2021   Fatty liver    Fatty liver    Hyperlipidemia    TB lung, latent 10/15/2020   Thrombophlebitis leg    left    Family History  Problem Relation Age of Onset   Cancer Other        colon   Diabetes Father    Diabetes Brother     Past Surgical History:  Procedure Laterality Date   GANGLION CYST EXCISION     HAND SURGERY     repair of laceration of right hand   INFECTED SKIN DEBRIDEMENT     right index finger   TENDON REPAIR Left 09/27/2022   Procedure:  LEFT ANTERIOR TIBIAL TENDON RECONSTRUCTION;  Surgeon: Nadara Mustard, MD;  [facility];  Service: Orthopedics;  Laterality: Left;   VASECTOMY     Social History   Occupational History   Not on file  Tobacco Use   Smoking status: Never   Smokeless tobacco: Never  Vaping Use   Vaping status: Never Used  Substance and Sexual Activity   Alcohol use: Yes    Alcohol/week: 1.0 standard drink of alcohol    Types: 1 Glasses of wine per week   Drug use: Never   Sexual activity: Not on file

## 2023-01-25 ENCOUNTER — Telehealth: Payer: Self-pay | Admitting: Family Medicine

## 2023-01-25 ENCOUNTER — Other Ambulatory Visit: Payer: Self-pay

## 2023-01-25 DIAGNOSIS — E785 Hyperlipidemia, unspecified: Secondary | ICD-10-CM

## 2023-01-25 DIAGNOSIS — R931 Abnormal findings on diagnostic imaging of heart and coronary circulation: Secondary | ICD-10-CM

## 2023-01-25 NOTE — Telephone Encounter (Addendum)
Patient notified. Urgent referral ordered in EPIC. Referral coordinator notified.

## 2023-01-25 NOTE — Telephone Encounter (Signed)
Kenneth Sams, DO     Given symptoms and results from CT coronary calcium recommend urgent referral to cardiology. Would refrain from exercise at this time.

## 2023-01-25 NOTE — Telephone Encounter (Signed)
Copied from CRM [id number]. Topic: Clinical - Medical Advice >> Jan 25, 2023  3:12 PM Clayton Bibles wrote: Reason for CRM: Kenneth Zimmerman had a question for Dr. Adriana Simas. 5 minutes into an exercise program, he gets nauseated. It has happened several times over last few weeks. I goes away after 5 minutes of resting. Pulse is good, no chest pain. It only happens when he is exercising. He wants to know if he need to see Dr. Adriana Simas earlier, like a video visit. His appointment is Feb. 28. Please have Dr. Adriana Simas or his nurse call Kenneth Zimmerman back. Thanks

## 2023-01-31 ENCOUNTER — Encounter: Payer: Self-pay | Admitting: Internal Medicine

## 2023-01-31 ENCOUNTER — Ambulatory Visit: Payer: Medicare Other | Attending: Internal Medicine | Admitting: Internal Medicine

## 2023-01-31 ENCOUNTER — Other Ambulatory Visit (HOSPITAL_COMMUNITY)
Admission: RE | Admit: 2023-01-31 | Discharge: 2023-01-31 | Disposition: A | Discharge status: Home / Self Care | Payer: Medicare Other | Source: Ambulatory Visit | Attending: Internal Medicine | Admitting: Internal Medicine

## 2023-01-31 VITALS — BP 124/90 | HR 62 | Ht 76.0 in | Wt 295.0 lb

## 2023-01-31 DIAGNOSIS — Z8249 Family history of ischemic heart disease and other diseases of the circulatory system: Secondary | ICD-10-CM | POA: Diagnosis not present

## 2023-01-31 DIAGNOSIS — E785 Hyperlipidemia, unspecified: Secondary | ICD-10-CM | POA: Diagnosis present

## 2023-01-31 DIAGNOSIS — R931 Abnormal findings on diagnostic imaging of heart and coronary circulation: Secondary | ICD-10-CM | POA: Insufficient documentation

## 2023-01-31 DIAGNOSIS — R110 Nausea: Secondary | ICD-10-CM | POA: Insufficient documentation

## 2023-01-31 DIAGNOSIS — R7303 Prediabetes: Secondary | ICD-10-CM | POA: Diagnosis not present

## 2023-01-31 LAB — LIPID PANEL
Cholesterol: 167 mg/dL (ref 0–200)
HDL: 40 mg/dL — ABNORMAL LOW (ref >40)
LDL Cholesterol: 85 mg/dL (ref 0–99)
Total CHOL/HDL Ratio: 4.2 {ratio}
Triglycerides: 211 mg/dL — ABNORMAL HIGH (ref <150)
VLDL: 42 mg/dL — ABNORMAL HIGH (ref 0–40)

## 2023-01-31 NOTE — Progress Notes (Signed)
Cardiology Office Note  Date: 01/31/2023   ID: Kenneth Signorelli., DOB 1950/11/26, MRN [id number]  PCP:  [name], DO  Cardiologist:  Marjo Bicker, MD Electrophysiologist:  None   History of Present Illness: Kenneth Zimmerman is a 73 y.o. male known to have fatty liver was referred to cardiology clinic for evaluation of elevated coronary calcium score.  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  He was sedentary since October 2024 due to left ankle surgery.  Recently started working out.  He started to feel nauseous 5 to 10 minutes into the workout for the last 3 to 4 weeks.  He rested and then resumed working out.  No angina, DOE during this time.  No other symptoms as well.  He has mild leg swelling on the left side due to recent ankle surgery.  He has a family history of premature CAD, his father had multiple heart attacks the first heart attack was at 63 years old.  His father had multiple stents as well.    Past Medical History:  Diagnosis Date   ADHD (attention deficit hyperactivity disorder)    Close exposure to COVID-19 virus 10/18/2021   Fatty liver    Fatty liver    Hyperlipidemia    TB lung, latent 10/15/2020   Thrombophlebitis leg    left    Past Surgical History:  Procedure Laterality Date   GANGLION CYST EXCISION     HAND SURGERY     repair of laceration of right hand   INFECTED SKIN DEBRIDEMENT     right index finger   TENDON REPAIR Left 09/27/2022   Procedure: LEFT ANTERIOR TIBIAL TENDON RECONSTRUCTION;  Surgeon: Nadara Mustard, MD;  Location: [facility] OR;  Service: Orthopedics;  Laterality: Left;   VASECTOMY      Current Outpatient Medications  Medication Sig Dispense Refill   cephALEXin (KEFLEX) 500 MG capsule Take 1 capsule (500 mg total) by mouth 3 (three) times daily. 30 capsule 0   escitalopram (LEXAPRO) 10 MG tablet Take 1 tablet (10 mg total)  by mouth daily. 90 tablet 3   fluconazole (DIFLUCAN) 150 MG tablet Take 1 tablet (150 mg total) by mouth 3 (three) times a week. 30 tablet 0   ketoconazole (NIZORAL) 2 % cream Apply BID to affected area (Patient taking differently: Apply 1 Application topically 2 (two) times daily as needed for irritation.) 60 g 2   naproxen sodium (ALEVE) 220 MG tablet Take 220 mg by mouth every 12 (twelve) hours as needed (Muacle and joint pain).     oxyCODONE-acetaminophen (PERCOCET/ROXICET) 5-325 MG tablet Take 1 tablet by mouth every 4 (four) hours as needed. 30 tablet 0   rivaroxaban (XARELTO) 20 MG TABS tablet Take 1 tablet (20 mg total) by mouth daily with supper. 90 tablet 3   rosuvastatin (CRESTOR) 5 MG tablet Take 1 tablet (5 mg total) by mouth daily. 90 tablet 3   sildenafil (REVATIO) 20 MG tablet Take 2-3 tablets (40-60 mg total) by mouth daily as needed. 30 tablet 5   No current facility-administered medications for this visit.   Allergies:  Patient has no known allergies.   Social History: The patient  reports that he has never smoked. He has never used smokeless tobacco. He reports current alcohol use of about 1.0 standard drink of alcohol per week. He reports that he does not use drugs.   Family  History: The patient's family history includes Cancer in an other family member; Diabetes in his brother and father.   ROS:  Please see the history of present illness. Otherwise, complete review of systems is positive for none.  All other systems are reviewed and negative.   Physical Exam: VS:  There were no vitals taken for this visit., BMI There is no height or weight on file to calculate BMI.  Wt Readings from Last 3 Encounters:  09/27/22 280 lb (127 kg)  09/19/22 285 lb (129.3 kg)  08/31/22 287 lb 14.4 oz (130.6 kg)    General: Patient appears comfortable at rest. HEENT: Conjunctiva and lids normal, oropharynx clear with moist mucosa. Neck: Supple, no elevated JVP or carotid bruits, no  thyromegaly. Lungs: Clear to auscultation, nonlabored breathing at rest. Cardiac: Regular rate and rhythm, no S3 or significant systolic murmur, no pericardial rub. Abdomen: Soft, nontender, no hepatomegaly, bowel sounds present, no guarding or rebound. Extremities: No pitting edema, distal pulses 2+. Skin: Warm and dry. Musculoskeletal: No kyphosis. Neuropsychiatric: Alert and oriented x3, affect grossly appropriate.  Recent Labwork: 08/03/2022: ALT 40; AST 31; BUN 15; Creatinine, Ser 1.20; Potassium 4.4; Sodium 140 09/27/2022: Hemoglobin 15.2; Platelets 170     Component Value Date/Time   CHOL 179 08/03/2022 1220   TRIG 123 08/03/2022 1220   HDL 40 08/03/2022 1220   CHOLHDL 4.5 08/03/2022 1220   LDLCALC 117 (H) 08/03/2022 1220     Assessment and Plan:  Exertional nausea Elevated coronary calcium score 278 (60th percentile for age and sex matched control) Hyperlipidemia, not at goal Family history of premature CAD (Father had multiple heart attacks and PCI, early 75s) Factor V Leiden mutation   -Patient is a retired OB/GYN MD, 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  Currently residing in [place]. Working in [employer]. -Ongoing exertional nausea 5 to 10 minutes into the exercise for the last 3 to 4 weeks.  He does have elevated coronary calcium score of 278, in LAD and RCA.  Will obtain exercise Myoview to rule out any ischemia in the LAD and RCA territories. He would like to get this test scheduled on 02/02/2023.  Will expedite.  Continue rosuvastatin 5 mg nightly.  Goal LDL less than 100.  Obtain lipid panel, lipoprotein a levels.  Not on aspirin due to Xarelto use. -Continue Xarelto 20 mg nightly with supper.   Medication Adjustments/Labs and Tests Ordered: Current medicines are reviewed at length with the patient today.  Concerns regarding medicines are outlined above.    Disposition:  Follow up 1 month  Signed, Alice Burnside Verne Spurr, MD, 01/31/2023  8:54 AM    [facility] at [facility] [address] [place], [place] [address]

## 2023-01-31 NOTE — Patient Instructions (Signed)
Medication Instructions:   Your physician recommends that you continue on your current medications as directed. Please refer to the Current Medication list given to you today.  Labwork: Fasting Lipids, Lp-a  Testing/Procedures: Your physician has requested that you have en exercise stress myoview. For further information please visit [email or web address] Please follow instruction sheet, as given.   Follow-Up: 03/12/23 [facility] with Dr.Mallipeddi  Any Other Special Instructions Will Be Listed Below (If Applicable).  If you need a refill on your cardiac medications before your next appointment, please call your pharmacy.

## 2023-01-31 NOTE — Addendum Note (Signed)
Addended by: Marlyn Corporal A on: 01/31/2023 10:13 AM   Modules accepted: Orders

## 2023-02-01 ENCOUNTER — Ambulatory Visit (HOSPITAL_COMMUNITY)
Admission: RE | Admit: 2023-02-01 | Discharge: 2023-02-01 | Disposition: A | Discharge status: Home / Self Care | Payer: Medicare Other | Source: Ambulatory Visit | Attending: Internal Medicine | Admitting: Internal Medicine

## 2023-02-01 ENCOUNTER — Encounter (HOSPITAL_COMMUNITY)
Admission: RE | Admit: 2023-02-01 | Discharge: 2023-02-01 | Disposition: A | Discharge status: Home / Self Care | Payer: Medicare Other | Source: Ambulatory Visit | Attending: Internal Medicine | Admitting: Internal Medicine

## 2023-02-01 ENCOUNTER — Encounter (HOSPITAL_COMMUNITY): Payer: Self-pay

## 2023-02-01 DIAGNOSIS — Z8249 Family history of ischemic heart disease and other diseases of the circulatory system: Secondary | ICD-10-CM | POA: Diagnosis not present

## 2023-02-01 DIAGNOSIS — R931 Abnormal findings on diagnostic imaging of heart and coronary circulation: Secondary | ICD-10-CM | POA: Diagnosis not present

## 2023-02-01 DIAGNOSIS — R110 Nausea: Secondary | ICD-10-CM | POA: Insufficient documentation

## 2023-02-01 LAB — NM MYOCAR MULTI W/SPECT W/WALL MOTION / EF
Angina Index: 0
Duke Treadmill Score: 10
Estimated workload: 10.3
Exercise duration (min): 9 min
Exercise duration (sec): 54 s
LV dias vol: 126 mL (ref 62–150)
LV sys vol: 55 mL
MPHR: 149 {beats}/min
Nuc Stress EF: 57 %
Peak HR: 129 {beats}/min
Percent HR: 86 %
RATE: 0.5
RPE: 13
Rest HR: 55 {beats}/min
Rest Nuclear Isotope Dose: 10.1 mCi
SDS: 1
SRS: 0
SSS: 1
ST Depression (mm): 0 mm
Stress Nuclear Isotope Dose: 30.5 mCi
TID: 1.05

## 2023-02-01 MED ORDER — TECHNETIUM TC 99M TETROFOSMIN IV KIT
30.0000 | PACK | Freq: Once | INTRAVENOUS | Status: AC | PRN
  Administered 2023-02-01: 30.5 via INTRAVENOUS

## 2023-02-01 MED ORDER — REGADENOSON 0.4 MG/5ML IV SOLN
INTRAVENOUS | Status: AC
  Filled 2023-02-01: qty 5

## 2023-02-01 MED ORDER — SODIUM CHLORIDE FLUSH 0.9 % IV SOLN
INTRAVENOUS | Status: AC
  Administered 2023-02-01: 10 mL via INTRAVENOUS
  Filled 2023-02-01: qty 10

## 2023-02-01 MED ORDER — TECHNETIUM TC 99M TETROFOSMIN IV KIT
10.0000 | PACK | Freq: Once | INTRAVENOUS | Status: AC | PRN
  Administered 2023-02-01: 10.1 via INTRAVENOUS

## 2023-02-02 ENCOUNTER — Encounter: Payer: Self-pay | Admitting: Family Medicine

## 2023-02-02 ENCOUNTER — Encounter (HOSPITAL_COMMUNITY): Payer: Medicare Other

## 2023-02-02 ENCOUNTER — Encounter: Payer: Self-pay | Admitting: Internal Medicine

## 2023-02-02 ENCOUNTER — Inpatient Hospital Stay (HOSPITAL_COMMUNITY): Admission: RE | Admit: 2023-02-02 | Payer: Medicare Other | Source: Ambulatory Visit

## 2023-02-02 LAB — LIPOPROTEIN A (LPA): Lipoprotein (a): 17.1 nmol/L (ref <75.0)

## 2023-02-05 ENCOUNTER — Other Ambulatory Visit: Payer: Self-pay

## 2023-02-05 ENCOUNTER — Other Ambulatory Visit: Payer: Self-pay | Admitting: Family Medicine

## 2023-02-05 DIAGNOSIS — Z79899 Other long term (current) drug therapy: Secondary | ICD-10-CM

## 2023-02-05 DIAGNOSIS — E785 Hyperlipidemia, unspecified: Secondary | ICD-10-CM

## 2023-02-05 MED ORDER — ROSUVASTATIN CALCIUM 10 MG PO TABS: 10.0000 mg | ORAL_TABLET | Freq: Every day | ORAL | 1 refills | Status: DC

## 2023-02-05 NOTE — Telephone Encounter (Signed)
Nurses I sent in rosuvastatin 10 mg 1 daily to his pharmacy in [facility] It would be advisable to do follow-up lipid liver in 10 to 12 weeks Please go ahead and order these then Dr. Emelda Fear can do these at that time Keep all regular follow-up visits with Dr. Adriana Simas sooner if any problems  Thanks-Dr. Lorin Picket (Covering for Dr. Adriana Simas)

## 2023-02-27 ENCOUNTER — Encounter: Payer: Self-pay | Admitting: Family Medicine

## 2023-02-27 LAB — HEPATIC FUNCTION PANEL
ALT: 34 IU/L (ref 0–44)
AST: 24 IU/L (ref 0–40)
Albumin: 4.4 g/dL (ref 3.8–4.8)
Alkaline Phosphatase: 67 IU/L (ref 44–121)
Bilirubin Total: 0.7 mg/dL (ref 0.0–1.2)
Bilirubin, Direct: 0.21 mg/dL (ref 0.00–0.40)
Total Protein: 6.8 g/dL (ref 6.0–8.5)

## 2023-02-27 LAB — LIPID PANEL
Chol/HDL Ratio: 3 ratio (ref 0.0–5.0)
Cholesterol, Total: 130 mg/dL (ref 100–199)
HDL: 44 mg/dL (ref >39)
LDL Chol Calc (NIH): 66 mg/dL (ref 0–99)
Triglycerides: 106 mg/dL (ref 0–149)
VLDL Cholesterol Cal: 20 mg/dL (ref 5–40)

## 2023-03-02 ENCOUNTER — Ambulatory Visit: Payer: BLUE CROSS/BLUE SHIELD | Admitting: Family Medicine

## 2023-03-06 ENCOUNTER — Encounter (INDEPENDENT_AMBULATORY_CARE_PROVIDER_SITE_OTHER): Payer: Self-pay | Admitting: *Deleted

## 2023-03-12 ENCOUNTER — Ambulatory Visit: Payer: Medicare Other | Admitting: Internal Medicine

## 2023-03-14 ENCOUNTER — Ambulatory Visit: Payer: BLUE CROSS/BLUE SHIELD | Admitting: Family Medicine

## 2023-04-12 ENCOUNTER — Ambulatory Visit: Admitting: Internal Medicine

## 2023-04-13 ENCOUNTER — Ambulatory Visit: Attending: Internal Medicine | Admitting: Internal Medicine

## 2023-04-13 ENCOUNTER — Encounter: Payer: Self-pay | Admitting: Internal Medicine

## 2023-04-13 VITALS — BP 118/78 | HR 48 | Ht 77.0 in | Wt 297.0 lb

## 2023-04-13 DIAGNOSIS — Z8249 Family history of ischemic heart disease and other diseases of the circulatory system: Secondary | ICD-10-CM | POA: Diagnosis not present

## 2023-04-13 DIAGNOSIS — R931 Abnormal findings on diagnostic imaging of heart and coronary circulation: Secondary | ICD-10-CM | POA: Diagnosis not present

## 2023-04-13 DIAGNOSIS — E7849 Other hyperlipidemia: Secondary | ICD-10-CM

## 2023-04-13 DIAGNOSIS — R110 Nausea: Secondary | ICD-10-CM

## 2023-04-13 DIAGNOSIS — D6851 Activated protein C resistance: Secondary | ICD-10-CM | POA: Diagnosis not present

## 2023-04-13 NOTE — Progress Notes (Signed)
 Cardiology Office Note  Date: 04/13/2023   ID: Kenneth Zimmerman., DOB 04-Mar-1950, MRN [id number]  PCP:  Kenneth Sams, DO  Cardiologist:  Kenneth Bicker, MD Electrophysiologist:  None   History of Present Illness: Kenneth Zimmerman is a 73 y.o. male known to have fatty liver is here for follow-up visit.  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  He was sedentary since October 2024 due to left ankle surgery.  Recently started working out.  He started to feel nauseous 5 to 10 minutes into the workout for the last 3 to 4 weeks.  He rested and then resumed working out.  He has a family history of premature CAD, his father had multiple heart attacks the first heart attack was at 64 years old.  His father had multiple stents as well.  CT cardiac calcium scoring test showed coronary calcium score of 278, 60th percentile for age and sex matched control.  He underwent exercise Myoview that showed no evidence of ischemia.  He is here for follow-up visit.  Drove from [place] this morning.  Does not have symptoms of angina, DOE, dizziness, syncope.  Has leg swelling in LLE but this was from DVT years ago.  Exertional nausea likely from bicycling.  Past Medical History:  Diagnosis Date   ADHD (attention deficit hyperactivity disorder)    Close exposure to COVID-19 virus 10/18/2021   Fatty liver    Fatty liver    Hyperlipidemia    TB lung, latent 10/15/2020   Thrombophlebitis leg    left    Past Surgical History:  Procedure Laterality Date   GANGLION CYST EXCISION     HAND SURGERY     repair of laceration of right hand   INFECTED SKIN DEBRIDEMENT     right index finger   TENDON REPAIR Left 09/27/2022   Procedure: LEFT ANTERIOR TIBIAL TENDON RECONSTRUCTION;  Surgeon: Kenneth Mustard, MD;  Location: [facility] OR;  Service: Orthopedics;  Laterality: Left;   VASECTOMY      Current  Outpatient Medications  Medication Sig Dispense Refill   escitalopram (LEXAPRO) 10 MG tablet Take 1 tablet (10 mg total) by mouth daily. 90 tablet 3   fluconazole (DIFLUCAN) 150 MG tablet Take 1 tablet (150 mg total) by mouth 3 (three) times a week. 30 tablet 0   ketoconazole (NIZORAL) 2 % cream Apply BID to affected area (Patient taking differently: Apply 1 Application topically 2 (two) times daily as needed for irritation.) 60 g 2   naproxen sodium (ALEVE) 220 MG tablet Take 220 mg by mouth every 12 (twelve) hours as needed (Muacle and joint pain).     rivaroxaban (XARELTO) 20 MG TABS tablet Take 1 tablet (20 mg total) by mouth daily with supper. 90 tablet 3   rosuvastatin (CRESTOR) 10 MG tablet Take 1 tablet (10 mg total) by mouth daily. 90 tablet 1   No current facility-administered medications for this visit.   Allergies:  Patient has no known allergies.   Social History: The patient  reports that he has never smoked. He has never used smokeless tobacco. He reports current alcohol use of about 1.0 standard drink of alcohol per week. He reports that he does not use drugs.   Family History: The patient's family history includes Cancer in an other family member; Diabetes in his brother and father.   ROS:  Please see the history of  present illness. Otherwise, complete review of systems is positive for none.  All other systems are reviewed and negative.   Physical Exam: VS:  BP 118/78   Pulse (!) 48   Ht 6\' 5"  (1.956 m)   Wt 297 lb (134.7 kg)   SpO2 98%   BMI 35.22 kg/m , BMI Body mass index is 35.22 kg/m.  Wt Readings from Last 3 Encounters:  04/13/23 297 lb (134.7 kg)  01/31/23 295 lb (133.8 kg)  09/27/22 280 lb (127 kg)    General: Patient appears comfortable at rest. HEENT: Conjunctiva and lids normal, oropharynx clear with moist mucosa. Neck: Supple, no elevated JVP or carotid bruits, no thyromegaly. Lungs: Clear to auscultation, nonlabored breathing at rest. Cardiac:  Regular rate and rhythm, no S3 or significant systolic murmur, no pericardial rub. Abdomen: Soft, nontender, no hepatomegaly, bowel sounds present, no guarding or rebound. Extremities: No pitting edema, distal pulses 2+. Skin: Warm and dry. Musculoskeletal: No kyphosis. Neuropsychiatric: Alert and oriented x3, affect grossly appropriate.  Recent Labwork: 08/03/2022: BUN 15; Creatinine, Ser 1.20; Potassium 4.4; Sodium 140 09/27/2022: Hemoglobin 15.2; Platelets 170 02/26/2023: ALT 34; AST 24     Component Value Date/Time   CHOL 130 02/26/2023 1022   TRIG 106 02/26/2023 1022   HDL 44 02/26/2023 1022   CHOLHDL 3.0 02/26/2023 1022   CHOLHDL 4.2 01/31/2023 1019   VLDL 42 (H) 01/31/2023 1019   LDLCALC 66 02/26/2023 1022     Assessment and Plan:  Exertional nausea, resolved: Likely from bicycling.  Elevated coronary calcium score 278, 68 percentile for age and sex matched control: Not on aspirin due to Xarelto use.  Continue rosuvastatin 10 mg nightly.  Discussed heart healthy diet and exercise.  Hyperlipidemia, at goal: LDL 66 in February 2025.  Continue rosuvastatin 10 mg nightly.  Family history of premature CAD (Father had multiple heart attacks and PCI, early 22s)  Factor V Leiden mutation: Continue Xarelto 20 mg daily with supper.   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  Currently residing in [place]. Working in [employer].   Medication Adjustments/Labs and Tests Ordered: Current medicines are reviewed at length with the patient today.  Concerns regarding medicines are outlined above.    Disposition:  Follow up PRN  Signed, Kenneth Sheppard Verne Spurr, MD, 04/13/2023 10:41 AM    [facility] [address], [place], [place] [address]

## 2023-04-13 NOTE — Patient Instructions (Addendum)

## 2023-08-17 ENCOUNTER — Other Ambulatory Visit: Payer: Self-pay | Admitting: Family Medicine

## 2023-08-24 ENCOUNTER — Encounter: Payer: Self-pay | Admitting: Radiology

## 2023-08-25 ENCOUNTER — Other Ambulatory Visit: Payer: Self-pay | Admitting: Family Medicine

## 2023-10-19 ENCOUNTER — Ambulatory Visit

## 2023-11-05 ENCOUNTER — Encounter: Payer: Self-pay | Admitting: Radiology

## 2024-01-31 ENCOUNTER — Ambulatory Visit: Admitting: Family Medicine

## 2024-01-31 DIAGNOSIS — B351 Tinea unguium: Secondary | ICD-10-CM | POA: Diagnosis not present

## 2024-01-31 DIAGNOSIS — Z130 Encounter for screening for diseases of the blood and blood-forming organs and certain disorders involving the immune mechanism: Secondary | ICD-10-CM

## 2024-01-31 DIAGNOSIS — Z7985 Long-term (current) use of injectable non-insulin antidiabetic drugs: Secondary | ICD-10-CM

## 2024-01-31 DIAGNOSIS — L989 Disorder of the skin and subcutaneous tissue, unspecified: Secondary | ICD-10-CM

## 2024-01-31 DIAGNOSIS — K219 Gastro-esophageal reflux disease without esophagitis: Secondary | ICD-10-CM | POA: Diagnosis not present

## 2024-01-31 DIAGNOSIS — D6851 Activated protein C resistance: Secondary | ICD-10-CM | POA: Diagnosis not present

## 2024-01-31 DIAGNOSIS — Z125 Encounter for screening for malignant neoplasm of prostate: Secondary | ICD-10-CM

## 2024-01-31 DIAGNOSIS — E119 Type 2 diabetes mellitus without complications: Secondary | ICD-10-CM | POA: Diagnosis not present

## 2024-01-31 DIAGNOSIS — R7303 Prediabetes: Secondary | ICD-10-CM

## 2024-01-31 DIAGNOSIS — Z7901 Long term (current) use of anticoagulants: Secondary | ICD-10-CM | POA: Diagnosis not present

## 2024-01-31 DIAGNOSIS — Z86718 Personal history of other venous thrombosis and embolism: Secondary | ICD-10-CM

## 2024-01-31 DIAGNOSIS — E7849 Other hyperlipidemia: Secondary | ICD-10-CM

## 2024-01-31 MED ORDER — FLUCONAZOLE 150 MG PO TABS: 150.0000 mg | ORAL_TABLET | ORAL | 0 refills | Status: AC

## 2024-01-31 MED ORDER — PANTOPRAZOLE SODIUM 40 MG PO TBEC: 40.0000 mg | DELAYED_RELEASE_TABLET | Freq: Every day | ORAL | 3 refills | Status: AC

## 2024-01-31 MED ORDER — RIVAROXABAN 20 MG PO TABS: 20.0000 mg | ORAL_TABLET | Freq: Every day | ORAL | 3 refills | Status: AC

## 2024-01-31 MED ORDER — ROSUVASTATIN CALCIUM 10 MG PO TABS: 10.0000 mg | ORAL_TABLET | Freq: Every day | ORAL | 3 refills | Status: AC

## 2024-01-31 MED ORDER — ESCITALOPRAM OXALATE 10 MG PO TABS: 10.0000 mg | ORAL_TABLET | Freq: Every day | ORAL | 3 refills | Status: AC

## 2024-01-31 MED ORDER — ZEPBOUND 2.5 MG/0.5ML ~~LOC~~ SOAJ: 2.5000 mg | SUBCUTANEOUS | 0 refills | Status: DC

## 2024-01-31 NOTE — Patient Instructions (Signed)
Medications as prescribed.  Labs today.  Follow up in 6 months.  

## 2024-02-01 LAB — LIPID PANEL
Chol/HDL Ratio: 3.6 ratio (ref 0.0–5.0)
Cholesterol, Total: 156 mg/dL (ref 100–199)
HDL: 43 mg/dL
LDL Chol Calc (NIH): 85 mg/dL (ref 0–99)
Triglycerides: 161 mg/dL — ABNORMAL HIGH (ref 0–149)
VLDL Cholesterol Cal: 28 mg/dL (ref 5–40)

## 2024-02-01 LAB — HEMOGLOBIN A1C
Est. average glucose Bld gHb Est-mCnc: 154 mg/dL
Hgb A1c MFr Bld: 7 % — ABNORMAL HIGH (ref 4.8–5.6)

## 2024-02-01 LAB — CMP14+EGFR
ALT: 59 [IU]/L — ABNORMAL HIGH (ref 0–44)
AST: 42 [IU]/L — ABNORMAL HIGH (ref 0–40)
Albumin: 4.7 g/dL (ref 3.8–4.8)
Alkaline Phosphatase: 75 [IU]/L (ref 47–123)
BUN/Creatinine Ratio: 12 (ref 10–24)
BUN: 16 mg/dL (ref 8–27)
Bilirubin Total: 0.7 mg/dL (ref 0.0–1.2)
CO2: 25 mmol/L (ref 20–29)
Calcium: 9.9 mg/dL (ref 8.6–10.2)
Chloride: 101 mmol/L (ref 96–106)
Creatinine, Ser: 1.31 mg/dL — ABNORMAL HIGH (ref 0.76–1.27)
Globulin, Total: 2.3 g/dL (ref 1.5–4.5)
Glucose: 116 mg/dL — ABNORMAL HIGH (ref 70–99)
Potassium: 4.6 mmol/L (ref 3.5–5.2)
Sodium: 142 mmol/L (ref 134–144)
Total Protein: 7 g/dL (ref 6.0–8.5)
eGFR: 58 mL/min/{1.73_m2} — ABNORMAL LOW

## 2024-02-01 LAB — MICROALBUMIN / CREATININE URINE RATIO
Creatinine, Urine: 218 mg/dL
Microalb/Creat Ratio: 4 mg/g{creat} (ref 0–29)
Microalbumin, Urine: 8.3 ug/mL

## 2024-02-01 LAB — CBC
Hematocrit: 49.7 % (ref 37.5–51.0)
Hemoglobin: 15.8 g/dL (ref 13.0–17.7)
MCH: 27.2 pg (ref 26.6–33.0)
MCHC: 31.8 g/dL (ref 31.5–35.7)
MCV: 86 fL (ref 79–97)
Platelets: 215 10*3/uL (ref 150–450)
RBC: 5.81 x10E6/uL — ABNORMAL HIGH (ref 4.14–5.80)
RDW: 14 % (ref 11.6–15.4)
WBC: 6.9 10*3/uL (ref 3.4–10.8)

## 2024-02-01 LAB — PSA: Prostate Specific Ag, Serum: 1.8 ng/mL (ref 0.0–4.0)

## 2024-02-02 ENCOUNTER — Ambulatory Visit: Payer: Self-pay | Admitting: Family Medicine

## 2024-02-02 ENCOUNTER — Other Ambulatory Visit: Payer: Self-pay | Admitting: Family Medicine

## 2024-02-02 MED ORDER — TIRZEPATIDE 2.5 MG/0.5ML ~~LOC~~ SOAJ: 2.5000 mg | SUBCUTANEOUS | 0 refills | Status: AC

## 2024-02-03 DIAGNOSIS — E119 Type 2 diabetes mellitus without complications: Secondary | ICD-10-CM | POA: Insufficient documentation

## 2024-02-03 DIAGNOSIS — K219 Gastro-esophageal reflux disease without esophagitis: Secondary | ICD-10-CM | POA: Insufficient documentation

## 2024-02-03 DIAGNOSIS — B351 Tinea unguium: Secondary | ICD-10-CM | POA: Insufficient documentation

## 2024-02-03 DIAGNOSIS — Z86718 Personal history of other venous thrombosis and embolism: Secondary | ICD-10-CM | POA: Insufficient documentation

## 2024-02-03 NOTE — Assessment & Plan Note (Signed)
 Fair control of LDL. Continue Crestor . Refilled.

## 2024-02-03 NOTE — Progress Notes (Signed)
 "  Subjective:  Patient ID: Kenneth Edsel GAILS., male    DOB: [birth year]  Age: 74 y.o. MRN: [id number]  CC:   Chief Complaint  Patient presents with   Weight Gain    Please see yellow paper. Refills on all medications. PSA requested.    HPI:  74 year old male presents for follow up. He has several issues/concerns today.  Reports weight gain. He would like to discuss GLP1 medications for weight loss. BMI currently 36.69.  Since weight gain, he has been experiencing frequent GERD.   He is requesting labs today including PSA.  Has a spot of erythema and sensitivity to the left cheek. He would like me to examine today.  Needs medication refills. He is requesting a refill on Diflucan  (prescribed by another provider for tinea pedis and onychomycosis.    Patient Active Problem List   Diagnosis Date Noted   History of deep venous thrombosis (DVT) of distal vein of left lower extremity 02/03/2024   Type 2 diabetes mellitus without complication, without long-term current use of insulin (HCC) 02/03/2024   GERD (gastroesophageal reflux disease) 02/03/2024   Onychomycosis 02/03/2024   Elevated coronary artery calcium  score 01/31/2023   Family history of premature CAD 01/31/2023   Rupture of extensor tendon of foot 09/27/2022   Factor V Leiden mutation 09/19/2022   Obesity 09/19/2022   Hepatic steatosis 08/31/2022   Hyperlipidemia 08/31/2022   Annual physical exam 08/31/2022   TB lung, latent 10/15/2020   Vocal cord atrophy 11/15/2016    Social Hx   Social History   Socioeconomic History   Marital status: Married    Spouse name: Not on file   Number of children: Not on file   Years of education: Not on file   Highest education level: Professional school degree (e.g., MD, DDS, DVM, JD)  Occupational History   Not on file  Tobacco Use   Smoking status: Never   Smokeless tobacco: Never  Vaping Use   Vaping status: Never Used  Substance and Sexual Activity   Alcohol use: Yes     Alcohol/week: 1.0 standard drink of alcohol    Types: 1 Glasses of wine per week   Drug use: Never   Sexual activity: Yes    Partners: Female    Comment: wife  Other Topics Concern   Not on file  Social History Narrative   Not on file   Social Drivers of Health   Tobacco Use: Low Risk (04/13/2023)   Patient History    Smoking Tobacco Use: Never    Smokeless Tobacco Use: Never    Passive Exposure: Not on file  Financial Resource Strain: Low Risk (01/30/2024)   Overall Financial Resource Strain (CARDIA)    Difficulty of Paying Living Expenses: Not hard at all  Food Insecurity: No Food Insecurity (01/30/2024)   Epic    Worried About Radiation Protection Practitioner of Food in the Last Year: Never true    Ran Out of Food in the Last Year: Never true  Transportation Needs: No Transportation Needs (01/30/2024)   Epic    Lack of Transportation (Medical): No    Lack of Transportation (Non-Medical): No  Physical Activity: Insufficiently Active (01/30/2024)   Exercise Vital Sign    Days of Exercise per Week: 2 days    Minutes of Exercise per Session: 50 min  Stress: No Stress Concern Present (01/30/2024)   [employer] of Occupational Health - Occupational Stress Questionnaire    Feeling of Stress: Only a little  Social Connections: Socially Integrated (01/30/2024)   Social Connection and Isolation Panel    Frequency of Communication with Friends and Family: More than three times a week    Frequency of Social Gatherings with Friends and Family: Twice a week    Attends Religious Services: More than 4 times per year    Active Member of Clubs or Organizations: Yes    Attends Banker Meetings: More than 4 times per year    Marital Status: Married  Depression (PHQ2-9): Low Risk (08/31/2022)   Depression (PHQ2-9)    PHQ-2 Score: 2  Alcohol Screen: Low Risk (01/30/2024)   Alcohol Screen    Last Alcohol Screening Score (AUDIT): 2  Housing: Low Risk (01/30/2024)   Epic    Unable to Pay for  Housing in the Last Year: No    Number of Times Moved in the Last Year: 0    Homeless in the Last Year: No  Utilities: Not on file  Health Literacy: Not on file    Review of Systems Per HPI  Objective:  BP 128/80   Pulse 64   Temp (!) 97.5 F (36.4 C)   Ht 6' 5 (1.956 m)   Wt (!) 309 lb 6.4 oz (140.3 kg)   SpO2 97%   BMI 36.69 kg/m      01/31/2024    3:41 PM 01/31/2024    2:50 PM 04/13/2023   10:35 AM  BP/Weight  Systolic BP 128 155 118  Diastolic BP 80 81 78  Wt. (Lbs)  309.4 297  BMI  36.69 kg/m2 35.22 kg/m2    Physical Exam Vitals and nursing note reviewed.  Constitutional:      General: He is not in acute distress.    Appearance: Normal appearance.  HENT:     Head: Normocephalic and atraumatic.      Comments: Raised lesion noted at the labeled location. Slightly rough/dry in texture. Cardiovascular:     Rate and Rhythm: Normal rate and regular rhythm.  Pulmonary:     Effort: Pulmonary effort is normal.     Breath sounds: Normal breath sounds. No wheezing or rales.  Neurological:     Mental Status: He is alert.  Psychiatric:        Mood and Affect: Mood normal.        Behavior: Behavior normal.     Lab Results  Component Value Date   WBC 6.9 01/31/2024   HGB 15.8 01/31/2024   HCT 49.7 01/31/2024   PLT 215 01/31/2024   GLUCOSE 116 (H) 01/31/2024   CHOL 156 01/31/2024   TRIG 161 (H) 01/31/2024   HDL 43 01/31/2024   LDLCALC 85 01/31/2024   ALT 59 (H) 01/31/2024   AST 42 (H) 01/31/2024   NA 142 01/31/2024   K 4.6 01/31/2024   CL 101 01/31/2024   CREATININE 1.31 (H) 01/31/2024   BUN 16 01/31/2024   CO2 25 01/31/2024   INR 1.6 02/06/2013   HGBA1C 7.0 (H) 01/31/2024     Assessment & Plan:  History of deep venous thrombosis (DVT) of distal vein of left lower extremity Assessment & Plan: Xarelto  refilled.  Orders: -     Rivaroxaban ; Take 1 tablet (20 mg total) by mouth daily with supper.  Dispense: 90 tablet; Refill: 3  Screening for  deficiency anemia  Other hyperlipidemia Assessment & Plan: Fair control of LDL. Continue Crestor . Refilled.  Orders: -     Rosuvastatin  Calcium ; Take 1 tablet (10 mg total) by mouth  daily.  Dispense: 90 tablet; Refill: 3 -     Lipid panel  Screening PSA (prostate specific antigen) -     PSA  Chronic anticoagulation -     CBC  Factor V Leiden mutation Assessment & Plan: Xarelto  refilled.   Type 2 diabetes mellitus without complication, without long-term current use of insulin (HCC) Assessment & Plan: A1c returned at 7.0. I initially sent in Zepbound . Discontinuing. Sending in Mounjaro .  Orders: -     CMP14+EGFR -     Hemoglobin A1c -     Microalbumin / creatinine urine ratio  Gastroesophageal reflux disease without esophagitis Assessment & Plan: Starting on Protonix .  Orders: -     Pantoprazole  Sodium; Take 1 tablet (40 mg total) by mouth daily.  Dispense: 90 tablet; Refill: 3  Onychomycosis Assessment & Plan: Diflucan  refilled.  Orders: -     Fluconazole ; Take 1 tablet (150 mg total) by mouth once a week.  Dispense: 24 tablet; Refill: 0  Skin lesion of face -     Ambulatory referral to Dermatology  Other orders -     Escitalopram  Oxalate; Take 1 tablet (10 mg total) by mouth daily.  Dispense: 90 tablet; Refill: 3    Follow-up:  6 months  Alisabeth Selkirk Bluford DO [address] [facility] "

## 2024-02-03 NOTE — Assessment & Plan Note (Signed)
 Xarelto  refilled.

## 2024-02-03 NOTE — Assessment & Plan Note (Signed)
 A1c returned at 7.0. I initially sent in Zepbound . Discontinuing. Sending in Mounjaro .

## 2024-02-03 NOTE — Assessment & Plan Note (Signed)
 Diflucan  refilled

## 2024-02-03 NOTE — Assessment & Plan Note (Signed)
Starting on Protonix. 

## 2024-02-07 ENCOUNTER — Telehealth: Payer: Self-pay | Admitting: Pharmacy Technician

## 2024-02-07 ENCOUNTER — Other Ambulatory Visit (HOSPITAL_COMMUNITY): Payer: Self-pay

## 2024-02-07 NOTE — Telephone Encounter (Signed)
 Pharmacy Patient Advocate Encounter   Received notification from [facility] that prior authorization for Mounjaro  2.5mg  is required/requested.   Insurance verification completed.   The patient is insured through [facility].   Per test claim: PA required; PA submitted to above mentioned insurance via Latent Key/confirmation #/EOC [id number] Status is pending
